# Patient Record
Sex: Male | Born: 1955 | Race: White | Hispanic: No | Smoking: Current every day smoker
Health system: Southern US, Community
[De-identification: ages and names within clinical notes are randomized; demographics above are authoritative.]

## PROBLEM LIST (undated history)

## (undated) DIAGNOSIS — I10 Essential (primary) hypertension: Secondary | ICD-10-CM

## (undated) HISTORY — PX: FRACTURE SURGERY: SHX138

---

## 2010-03-28 ENCOUNTER — Emergency Department: Payer: Self-pay | Admitting: Emergency Medicine

## 2010-07-01 ENCOUNTER — Emergency Department: Payer: Self-pay | Admitting: Emergency Medicine

## 2011-04-26 ENCOUNTER — Emergency Department: Payer: Self-pay | Admitting: Unknown Physician Specialty

## 2011-04-26 LAB — DRUG SCREEN, URINE
Barbiturates, Ur Screen: NEGATIVE (ref ?–200)
Benzodiazepine, Ur Scrn: NEGATIVE (ref ?–200)
Cannabinoid 50 Ng, Ur ~~LOC~~: POSITIVE (ref ?–50)
Cocaine Metabolite,Ur ~~LOC~~: POSITIVE (ref ?–300)
MDMA (Ecstasy)Ur Screen: NEGATIVE (ref ?–500)
Opiate, Ur Screen: NEGATIVE (ref ?–300)
Phencyclidine (PCP) Ur S: NEGATIVE (ref ?–25)
Tricyclic, Ur Screen: NEGATIVE (ref ?–1000)

## 2011-04-26 LAB — CBC
HCT: 42.3 % (ref 40.0–52.0)
HGB: 14.4 g/dL (ref 13.0–18.0)
MCH: 31 pg (ref 26.0–34.0)
MCHC: 34.1 g/dL (ref 32.0–36.0)
MCV: 91 fL (ref 80–100)
Platelet: 244 10*3/uL (ref 150–440)
RBC: 4.65 10*6/uL (ref 4.40–5.90)

## 2011-04-26 LAB — URINALYSIS, COMPLETE
Bacteria: NONE SEEN
Bilirubin,UR: NEGATIVE
Blood: NEGATIVE
Glucose,UR: NEGATIVE mg/dL (ref 0–75)
Leukocyte Esterase: NEGATIVE
Ph: 5 (ref 4.5–8.0)
RBC,UR: <1 /HPF (ref 0–5)
Squamous Epithelial: NONE SEEN
WBC UR: 1 /HPF (ref 0–5)

## 2011-04-27 LAB — BASIC METABOLIC PANEL
BUN: 8 mg/dL (ref 7–18)
Chloride: 108 mmol/L — ABNORMAL HIGH (ref 98–107)
Glucose: 127 mg/dL — ABNORMAL HIGH (ref 65–99)
Osmolality: 287 (ref 275–301)
Potassium: 3.9 mmol/L (ref 3.5–5.1)
Sodium: 144 mmol/L (ref 136–145)

## 2011-04-27 LAB — ACETAMINOPHEN LEVEL: Acetaminophen: <2 ug/mL

## 2011-04-27 LAB — ETHANOL: Ethanol: 101 mg/dL

## 2011-04-27 LAB — TROPONIN I: Troponin-I: <0.02 ng/mL

## 2011-06-08 ENCOUNTER — Emergency Department: Payer: Self-pay | Admitting: Emergency Medicine

## 2011-06-08 LAB — URINALYSIS, COMPLETE
Bilirubin,UR: NEGATIVE
Glucose,UR: NEGATIVE mg/dL
Leukocyte Esterase: NEGATIVE
Nitrite: NEGATIVE
Ph: 5
Protein: NEGATIVE
RBC,UR: <1 /HPF
Specific Gravity: 1.009
Squamous Epithelial: NONE SEEN
WBC UR: <1 /HPF

## 2011-06-08 LAB — CK TOTAL AND CKMB (NOT AT ARMC)
CK, Total: 306 U/L — ABNORMAL HIGH
CK-MB: 7.4 ng/mL — ABNORMAL HIGH (ref 0.5–3.6)
CK-MB: 5.7 ng/mL — ABNORMAL HIGH

## 2011-06-08 LAB — CBC
HCT: 42.9 %
HGB: 14.6 g/dL
MCH: 31.3 pg
MCHC: 33.9 g/dL
MCV: 92 fL
Platelet: 249 x10 3/mm 3
RBC: 4.65 x10 6/mm 3
RDW: 14.1 %
WBC: 8.5 x10 3/mm 3

## 2011-06-08 LAB — DRUG SCREEN, URINE
Amphetamines, Ur Screen: NEGATIVE
Barbiturates, Ur Screen: NEGATIVE
Benzodiazepine, Ur Scrn: NEGATIVE
Cannabinoid 50 Ng, Ur ~~LOC~~: NEGATIVE
Cocaine Metabolite,Ur ~~LOC~~: POSITIVE
MDMA (Ecstasy)Ur Screen: NEGATIVE
Methadone, Ur Screen: NEGATIVE
Opiate, Ur Screen: NEGATIVE
Phencyclidine (PCP) Ur S: NEGATIVE
Tricyclic, Ur Screen: NEGATIVE

## 2011-06-08 LAB — COMPREHENSIVE METABOLIC PANEL
Albumin: 4.1 g/dL (ref 3.4–5.0)
Alkaline Phosphatase: 79 U/L (ref 50–136)
BUN: 11 mg/dL (ref 7–18)
Bilirubin,Total: 1 mg/dL (ref 0.2–1.0)
Calcium, Total: 8.6 mg/dL (ref 8.5–10.1)
Creatinine: 0.88 mg/dL (ref 0.60–1.30)
Glucose: 87 mg/dL (ref 65–99)
SGOT(AST): 78 U/L — ABNORMAL HIGH (ref 15–37)
SGPT (ALT): 77 U/L
Total Protein: 7.5 g/dL (ref 6.4–8.2)

## 2011-06-08 LAB — TROPONIN I
Troponin-I: <0.02 ng/mL
Troponin-I: <0.02 ng/mL

## 2011-06-08 LAB — ETHANOL
Ethanol %: 0.046 %
Ethanol: 46 mg/dL

## 2011-06-15 ENCOUNTER — Emergency Department: Payer: Self-pay | Admitting: Emergency Medicine

## 2011-06-15 LAB — COMPREHENSIVE METABOLIC PANEL
Albumin: 4.1 g/dL (ref 3.4–5.0)
Alkaline Phosphatase: 89 U/L (ref 50–136)
BUN: 10 mg/dL (ref 7–18)
Bilirubin,Total: 0.9 mg/dL (ref 0.2–1.0)
Calcium, Total: 8.8 mg/dL (ref 8.5–10.1)
Creatinine: 1.01 mg/dL (ref 0.60–1.30)
EGFR (African American): >60
Glucose: 145 mg/dL — ABNORMAL HIGH (ref 65–99)
Osmolality: 285 (ref 275–301)
SGPT (ALT): 82 U/L — ABNORMAL HIGH
Sodium: 142 mmol/L (ref 136–145)

## 2011-06-15 LAB — DRUG SCREEN, URINE
Barbiturates, Ur Screen: NEGATIVE (ref ?–200)
Benzodiazepine, Ur Scrn: NEGATIVE (ref ?–200)
Cannabinoid 50 Ng, Ur ~~LOC~~: NEGATIVE (ref ?–50)
Cocaine Metabolite,Ur ~~LOC~~: POSITIVE (ref ?–300)
Opiate, Ur Screen: NEGATIVE (ref ?–300)
Tricyclic, Ur Screen: NEGATIVE (ref ?–1000)

## 2011-06-15 LAB — CBC
HGB: 15.5 g/dL (ref 13.0–18.0)
MCHC: 34 g/dL (ref 32.0–36.0)
Platelet: 291 10*3/uL (ref 150–440)
RBC: 4.92 10*6/uL (ref 4.40–5.90)

## 2011-06-15 LAB — TSH: Thyroid Stimulating Horm: 2.01 u[IU]/mL

## 2011-06-15 LAB — SALICYLATE LEVEL: Salicylates, Serum: 1.7 mg/dL

## 2011-06-15 LAB — ETHANOL: Ethanol %: 0.071 % (ref 0.000–0.080)

## 2011-09-13 ENCOUNTER — Emergency Department: Payer: Self-pay | Admitting: Emergency Medicine

## 2011-09-13 LAB — COMPREHENSIVE METABOLIC PANEL
Albumin: 3.8 g/dL (ref 3.4–5.0)
Anion Gap: 4 — ABNORMAL LOW (ref 7–16)
BUN: 12 mg/dL (ref 7–18)
Bilirubin,Total: 0.8 mg/dL (ref 0.2–1.0)
Calcium, Total: 8.6 mg/dL (ref 8.5–10.1)
Chloride: 107 mmol/L (ref 98–107)
Co2: 29 mmol/L (ref 21–32)
Creatinine: 1.37 mg/dL — ABNORMAL HIGH (ref 0.60–1.30)
EGFR (African American): >60
Osmolality: 280 (ref 275–301)
Potassium: 3.6 mmol/L (ref 3.5–5.1)
SGOT(AST): 34 U/L (ref 15–37)
SGPT (ALT): 56 U/L
Sodium: 140 mmol/L (ref 136–145)

## 2011-09-13 LAB — CBC
HCT: 47.5 % (ref 40.0–52.0)
MCH: 31.5 pg (ref 26.0–34.0)
MCHC: 33.4 g/dL (ref 32.0–36.0)
RBC: 5.04 10*6/uL (ref 4.40–5.90)
RDW: 14 % (ref 11.5–14.5)
WBC: 9.5 10*3/uL (ref 3.8–10.6)

## 2011-09-13 LAB — URINALYSIS, COMPLETE
Bacteria: NONE SEEN
Bilirubin,UR: NEGATIVE
Glucose,UR: NEGATIVE mg/dL (ref 0–75)
Ketone: NEGATIVE
Leukocyte Esterase: NEGATIVE
Ph: 6 (ref 4.5–8.0)
RBC,UR: 22 /HPF (ref 0–5)
Squamous Epithelial: NONE SEEN

## 2011-09-13 LAB — AMYLASE: Amylase: 28 U/L (ref 25–115)

## 2011-09-17 ENCOUNTER — Emergency Department: Payer: Self-pay | Admitting: Emergency Medicine

## 2011-09-17 LAB — CBC
HCT: 42.1 % (ref 40.0–52.0)
HGB: 14.1 g/dL (ref 13.0–18.0)
MCHC: 33.5 g/dL (ref 32.0–36.0)
RBC: 4.52 10*6/uL (ref 4.40–5.90)
WBC: 6.4 10*3/uL (ref 3.8–10.6)

## 2011-09-17 LAB — BASIC METABOLIC PANEL
Calcium, Total: 8.8 mg/dL (ref 8.5–10.1)
Chloride: 106 mmol/L (ref 98–107)
Creatinine: 1.08 mg/dL (ref 0.60–1.30)
Potassium: 4 mmol/L (ref 3.5–5.1)

## 2011-09-17 LAB — URINALYSIS, COMPLETE
Bilirubin,UR: NEGATIVE
Ketone: NEGATIVE
Ph: 6 (ref 4.5–8.0)
Protein: NEGATIVE
RBC,UR: 2 /HPF (ref 0–5)
Specific Gravity: 1.008 (ref 1.003–1.030)
Squamous Epithelial: NONE SEEN

## 2011-09-20 ENCOUNTER — Emergency Department: Payer: Self-pay | Admitting: Emergency Medicine

## 2011-10-30 ENCOUNTER — Inpatient Hospital Stay: Payer: Self-pay | Admitting: Internal Medicine

## 2011-10-30 LAB — COMPREHENSIVE METABOLIC PANEL
Albumin: 3.4 g/dL (ref 3.4–5.0)
Anion Gap: 6 — ABNORMAL LOW (ref 7–16)
BUN: 13 mg/dL (ref 7–18)
Bilirubin,Total: 0.7 mg/dL (ref 0.2–1.0)
Chloride: 111 mmol/L — ABNORMAL HIGH (ref 98–107)
Creatinine: 1.28 mg/dL (ref 0.60–1.30)
EGFR (African American): >60
Glucose: 136 mg/dL — ABNORMAL HIGH (ref 65–99)
Osmolality: 287 (ref 275–301)
Potassium: 3.9 mmol/L (ref 3.5–5.1)
SGPT (ALT): 53 U/L (ref 12–78)
Sodium: 143 mmol/L (ref 136–145)
Total Protein: 6.7 g/dL (ref 6.4–8.2)

## 2011-10-30 LAB — CBC
HGB: 14.6 g/dL (ref 13.0–18.0)
MCH: 32.3 pg (ref 26.0–34.0)
MCV: 95 fL (ref 80–100)
Platelet: 245 10*3/uL (ref 150–440)
RBC: 4.52 10*6/uL (ref 4.40–5.90)
RDW: 14.6 % — ABNORMAL HIGH (ref 11.5–14.5)
WBC: 5.7 10*3/uL (ref 3.8–10.6)

## 2011-10-30 LAB — URINALYSIS, COMPLETE
Glucose,UR: NEGATIVE mg/dL (ref 0–75)
Granular Cast: 1
Ketone: NEGATIVE
Leukocyte Esterase: NEGATIVE
Specific Gravity: 1.011 (ref 1.003–1.030)
Squamous Epithelial: NONE SEEN

## 2011-10-30 LAB — DRUG SCREEN, URINE
Barbiturates, Ur Screen: POSITIVE (ref ?–200)
Cannabinoid 50 Ng, Ur ~~LOC~~: NEGATIVE (ref ?–50)
Cocaine Metabolite,Ur ~~LOC~~: POSITIVE (ref ?–300)
Methadone, Ur Screen: NEGATIVE (ref ?–300)
Opiate, Ur Screen: POSITIVE (ref ?–300)
Phencyclidine (PCP) Ur S: NEGATIVE (ref ?–25)

## 2011-10-30 LAB — ACETAMINOPHEN LEVEL: Acetaminophen: <2 ug/mL

## 2011-10-30 LAB — ETHANOL: Ethanol: <3 mg/dL

## 2011-10-31 ENCOUNTER — Inpatient Hospital Stay: Payer: Self-pay | Admitting: Psychiatry

## 2011-10-31 LAB — CBC WITH DIFFERENTIAL/PLATELET
Basophil %: 0.9 %
Eosinophil #: 0.1 10*3/uL (ref 0.0–0.7)
Eosinophil %: 1.4 %
HCT: 40.3 % (ref 40.0–52.0)
Lymphocyte %: 24.3 %
MCH: 32.2 pg (ref 26.0–34.0)
MCHC: 34.3 g/dL (ref 32.0–36.0)
MCV: 94 fL (ref 80–100)
Neutrophil %: 63.3 %
Platelet: 211 10*3/uL (ref 150–440)
RBC: 4.3 10*6/uL — ABNORMAL LOW (ref 4.40–5.90)
RDW: 14.6 % — ABNORMAL HIGH (ref 11.5–14.5)
WBC: 6.8 10*3/uL (ref 3.8–10.6)

## 2011-10-31 LAB — BASIC METABOLIC PANEL
Anion Gap: 7 (ref 7–16)
BUN: 7 mg/dL (ref 7–18)
Calcium, Total: 7.9 mg/dL — ABNORMAL LOW (ref 8.5–10.1)
Co2: 24 mmol/L (ref 21–32)
Creatinine: 0.93 mg/dL (ref 0.60–1.30)
EGFR (African American): >60
Glucose: 125 mg/dL — ABNORMAL HIGH (ref 65–99)
Osmolality: 283 (ref 275–301)
Sodium: 142 mmol/L (ref 136–145)

## 2011-10-31 LAB — SALICYLATE LEVEL: Salicylates, Serum: 2.4 mg/dL

## 2011-11-03 LAB — URINALYSIS, COMPLETE
Bilirubin,UR: NEGATIVE
Leukocyte Esterase: NEGATIVE
Nitrite: NEGATIVE
Ph: 7 (ref 4.5–8.0)
Protein: NEGATIVE
RBC,UR: 27 /HPF (ref 0–5)

## 2013-07-16 IMAGING — CR DG ABDOMEN 1V
1 series · 2 of 2 positions shown · non-contrast
Comparison: none

REASON FOR EXAM: R/O Nephrolithiasis
COMMENTS:

[Series 1: supine kub · 0.17mm/px · 2 of 2 slices shown]
[im 1/2]
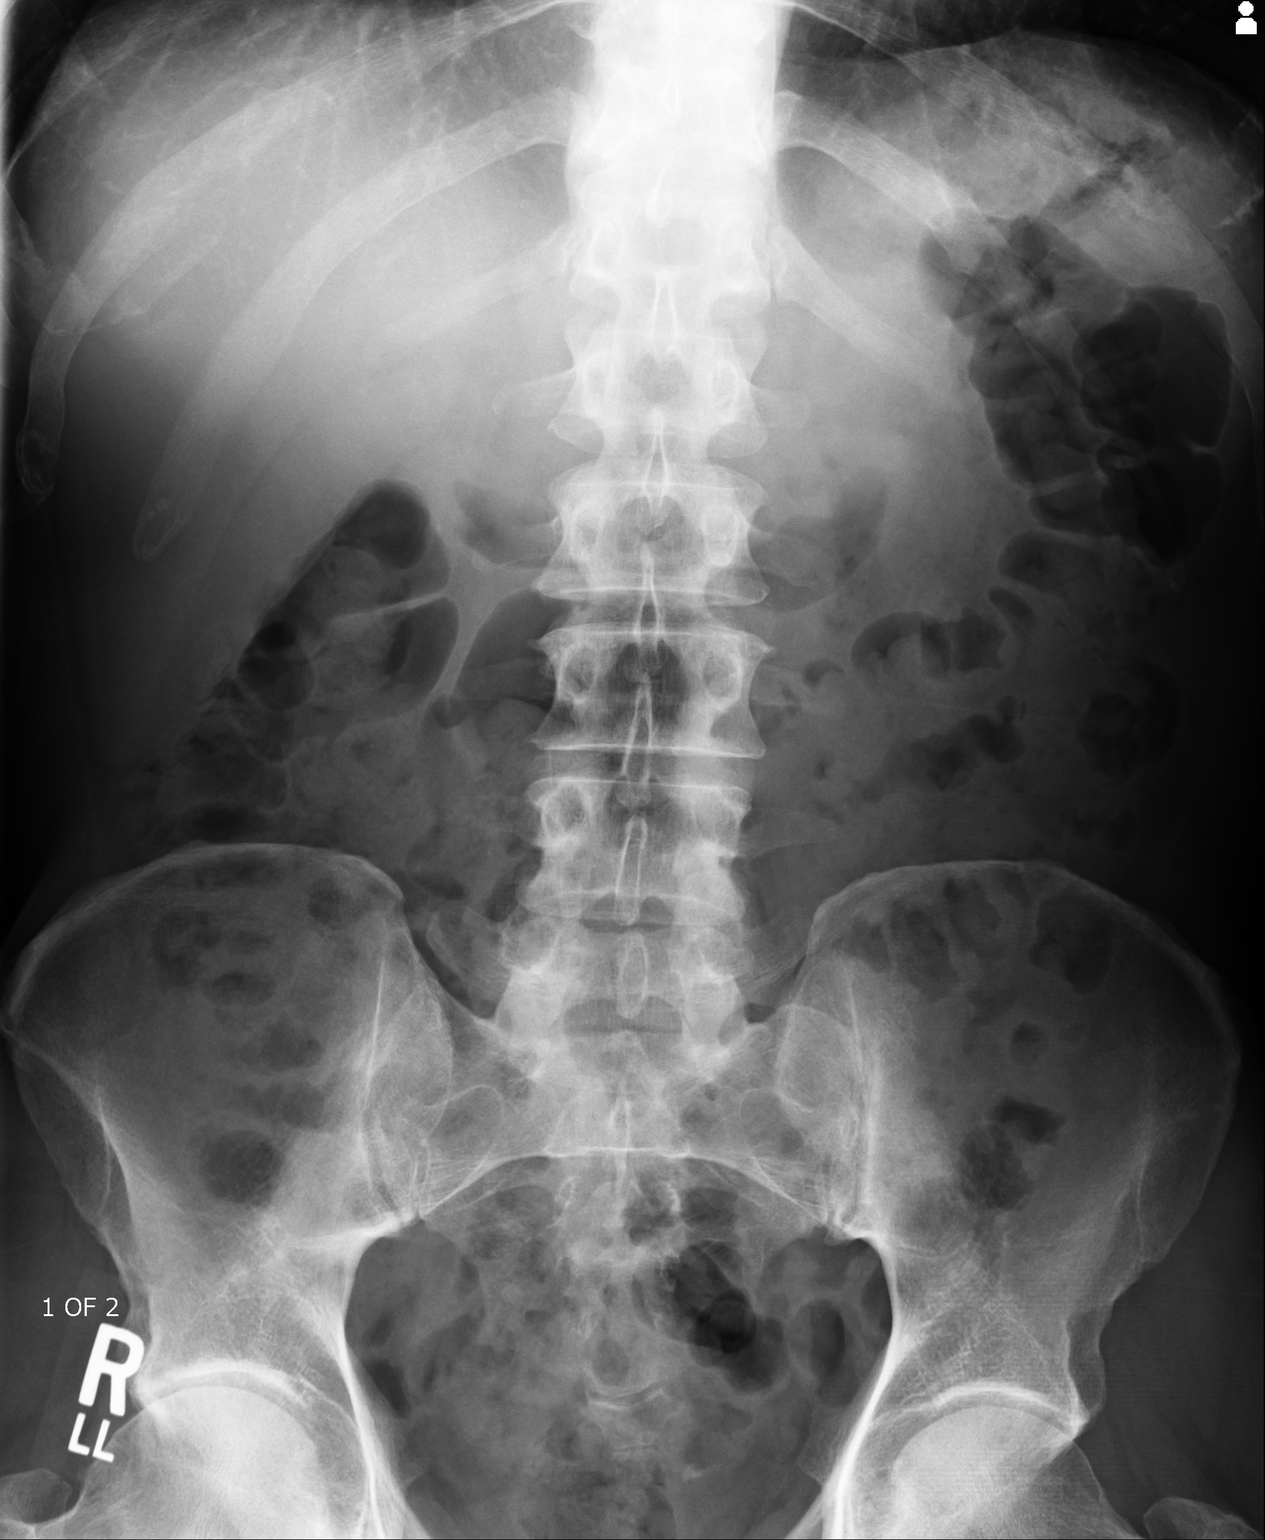
[im 2/2]
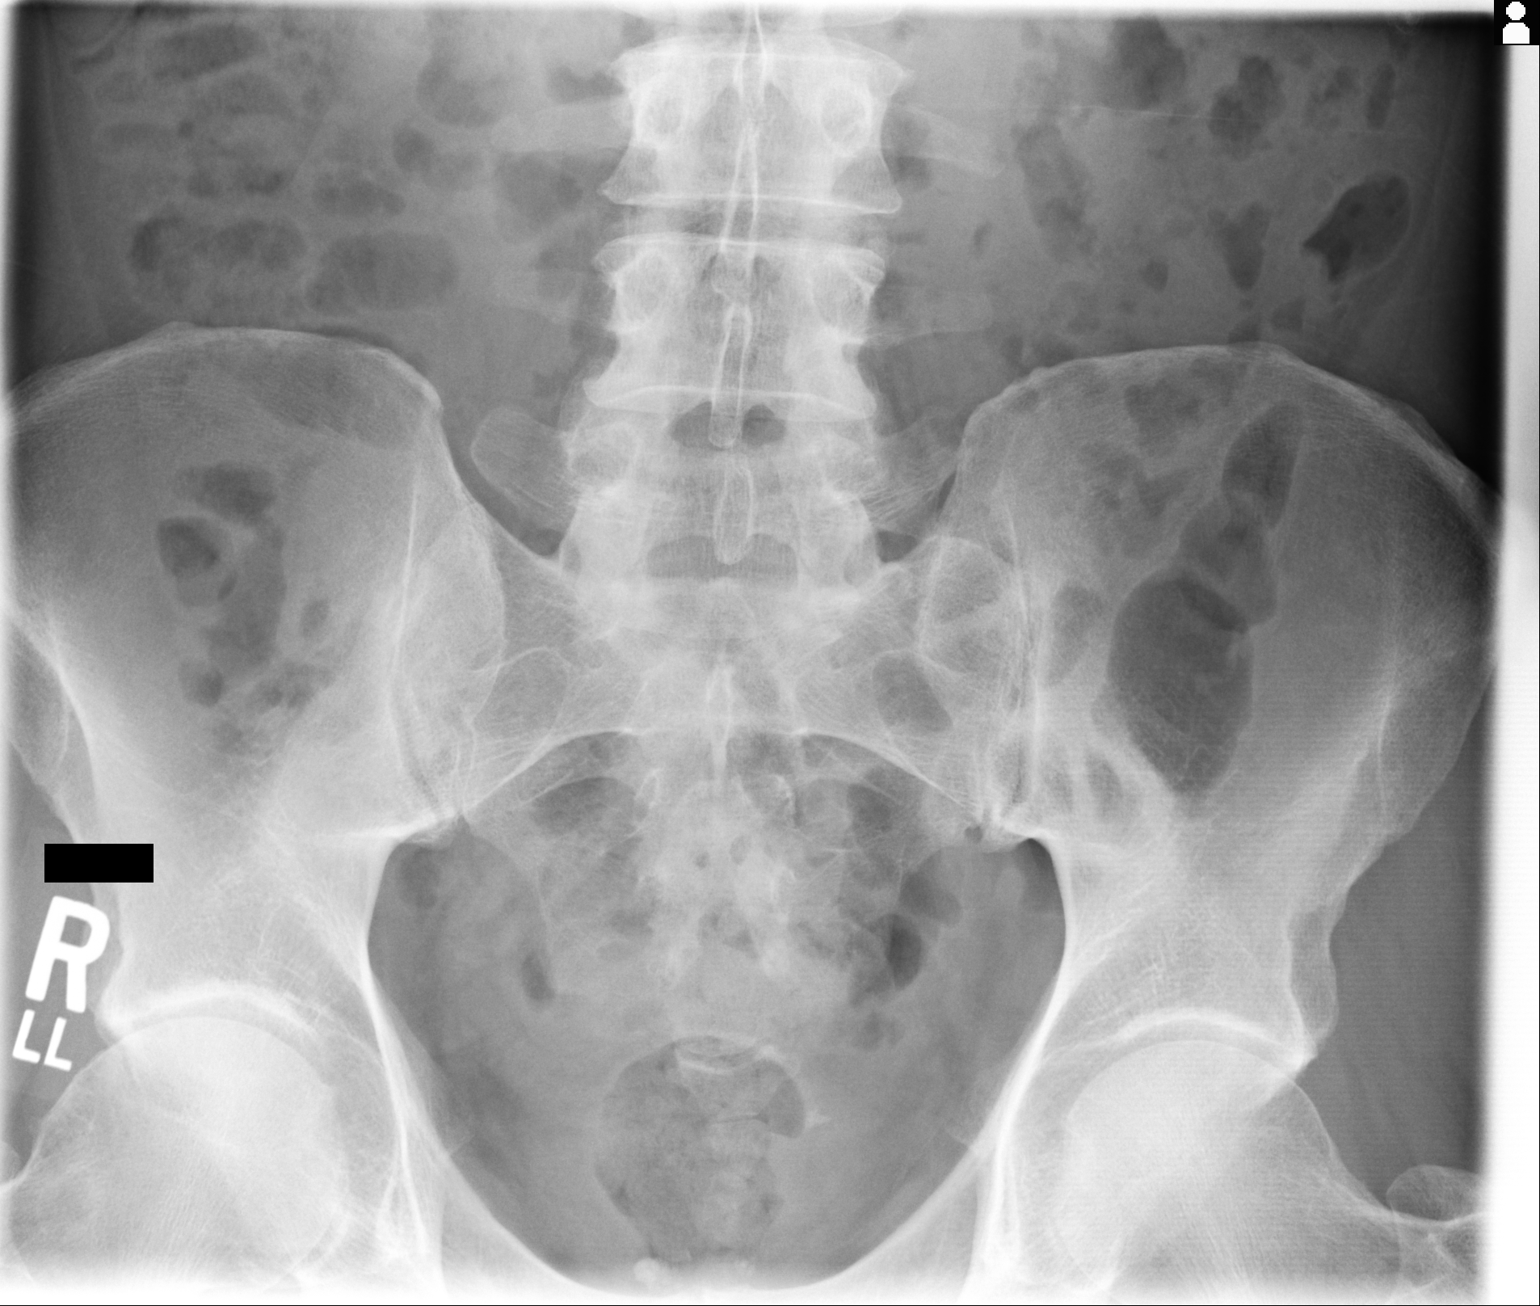

[2 of 2 positions shown; findings below may reference images not displayed]

PROCEDURE:     DXR - DXR KIDNEY URETER BLADDER  - November 04, 2011 [DATE]

RESULT:

Air is seen within nondilated loops of large and small bowel. There does not
appear to be evidence of calcified densities projecting in the expected
course of the right and left urinary collecting systems. The visualized bony
skeleton is unremarkable.
IMPRESSION: 1.  Nonobstructive bowel gas pattern.
2.  No radiographic evidence of renal calculus disease.

## 2014-07-12 NOTE — Discharge Summary (Signed)
PATIENT NAME:  Jorge Maldonado, SIRES MR#:  [id number] DATE OF BIRTH:  04-17-1955  DATE OF ADMISSION:  10/30/2011 DATE OF DISCHARGE:  10/31/2011  ADMITTING DIAGNOSIS: Overdose on multiple medications as well as hypotension.   DISCHARGE DIAGNOSES:  1. Overdose on multiple medications due to a suicidal attempt, status post psych evaluation. Will need further treatment in the Behavioral Unit.  2. Hypotension, result of antihypertensive overdose. Blood pressure is now normalized with epinephrine drip as well as normal saline infusion.  3. History of polysubstance abuse with alcohol and cocaine. He will need further detoxification.  4. History of suicidal ideation in the past.  5. Chronic Hepatitis C.  6. Osteoarthritis.  7. Status post right shoulder surgery.   PERTINENT LABORATORY AND EVALUATIONS: Glucose 136, BUN 13, creatinine 1.28, sodium 143, potassium 3.9, chloride 111. LFTs were normal. TSH 5.11. TUDS were barbiturate positive, cocaine positive, and opiate positive. WBC 5.7, hemoglobin 14.6, platelet count 245. Urinalysis nitrites negative, leukocytes negative. Salicylate level 3.2. Acetaminophen was less than 2.   EKG normal sinus rhythm without any ST-T wave changes.   CONSULTANT: Dr. Kathrine Haddock COURSE: Please refer to history and physical for details. In brief, the patient is a 59 year old white male with history of suicidal ideation and depression in the past who was brought to the ED after the patient ingested multiple medications including Klonopin and Percocet. He took a few of his mother's antihypertensives. The patient in the ED was noted to be very hypotensive. We were asked to admit the patient for medical monitoring. The patient was kept on aggressive IV fluids. He had to be started on epinephrine drip. With these interventions his hypotension resolved. The patient has been very depressed due to his recent social issues. He was seen by Dr. Maryruth Bun who saw the patient and recommended  he needed transfer to Behavioral Unit. The patient is currently stable for transfer to Behavioral Unit. He does have history of hypertension. If his blood pressure starts increasing, he can resume his doxazosin as taking at home. At this time he is doing much better and is stable for discharge.   DISCHARGE MEDICATIONS: Currently none.   FURTHER TREATMENT: Per Psychiatry   HOME OXYGEN: None.   DIET: Low sodium.   ACTIVITY: As tolerated.   REFERRAL: To Behavioral Medicine with disposition after psychiatric treatment from there.  TIME SPENT: 35 minutes.   ____________________________ [name], MD shp:drc D: 10/31/2011 15:19:00 ET T: 11/01/2011 10:48:15 ET JOB#: [id number]  cc: Kaydince Towles H. Allena Katz, MD, <Dictator> Charise Carwin MD ELECTRONICALLY SIGNED 11/02/2011 10:50

## 2014-07-12 NOTE — Consult Note (Signed)
Brief Consult Note: Diagnosis: S/P Overdose R/O Major Depression; Polysubstance Abuse.   Patient was seen by consultant.   Recommend further assessment or treatment.   Orders entered.   Comments: Jorge Maldonado is a 59 y/o Caucasian male with a history of polysubstance abuse including alcohol, cocaine and opiod use who was admitted to the CCU after overdosing on multiple medications. Per the intake nurse, he has been drinking close to a gallon of  beer daily and abusing cocaine and opiods. He was supposed to be in court today but overdosed instead because he thought his attorney was not doing anything for him. The patient himself was lethargic and not able to participate in the interview. Therefore, full consult could not be completed. Records from consult with Dr. Alycia Rossetti in March, 2013 reviewed. Please keep with 1:1 and on suicide precautions for now. Patient is under IVC. Will start Ativan per CIWA as well as give MVI, Thiamine and FA for alcohol withdrawl. Will return tomorrow to see if full consult can be completed. Agree with holding Celexa and Trazadone for now due to overdose.  Electronic Signatures: Caryn Section (MD)  (Signed 07-Aug-13 17:31)  Authored: Brief Consult Note   Last Updated: 07-Aug-13 17:31 by Caryn Section (MD)

## 2014-07-12 NOTE — H&P (Signed)
PATIENT NAME:  Jorge Maldonado MR#:  [id number] DATE OF BIRTH:  1955-08-25  DATE OF ADMISSION:  10/31/2011  REFERRING PHYSICIAN: Auburn Bilberry, MD    ADMITTING PHYSICIAN: Caryn Section, MD   REASON FOR ADMISSION: Status post overdose in suicide attempt.   IDENTIFYING INFORMATION: Jorge Maldonado is a 59 year old divorced unemployed Caucasian male currently living in the [place] area with his sister, brother-in-law and mother. He has one son, age 26. He has just been released from incarceration this past January and has spent over 30 years in jail already.   HISTORY OF PRESENT ILLNESS: Jorge Maldonado is a 59 year old divorced Caucasian male with a history of recurrent depression as well as polysubstance dependence, including alcohol dependence, opioid abuse, and cocaine abuse, who was admitted to the Critical Care Unit after he overdosed on a combination of multiple medications. He says he took all of his mother's doxazosin, 3 tablets of Klonopin and 2 Percocet. He  was admitted to the Critical Care Unit and placed on an epinephrine drip. The patient was apparently in the middle of a jury trial for a breaking and entering charge. He says he overdosed instead of going to court because he said he would rather be dead than go back to jail. He has been drinking 24 beers on a daily basis almost every day since being released from prison in January. He also has been abusing cocaine 2 to 3 times a week and abusing Percocet, Fioricet and Klonopin. He denies any heroin use. The patient says his depressive symptoms have worsened within the past 2 to 3 weeks as he is facing more legal charges and possibly being incarcerated again. He does endorse feelings of hopelessness and helplessness, frequent crying spells, and difficulty with focus and concentration. He also reports having difficulty with a low energy level. He denies any insomnia but says that the alcohol helps him to drink. No change in appetite. He has been  feeling suicidal for the past 2 to 3 days. The patient says that he wished that the overdose would have killed him. He denies any current psychotic symptoms, including auditory or visual hallucinations. No paranoid thoughts or delusions. The patient denies any problems with anxiety or panic attacks. He has had difficulty with irritable mood swings in the past but denies any history of any violence. He also denies any history of symptoms consistent with bipolar mania, including grandiose delusions, hyperreligious thoughts, hypersexual behavior, or decreased sleep for several days at a time with increased goal-directed behavior.   PAST PSYCHIATRIC HISTORY: The patient was diagnosed with ADD while in prison. He has been hospitalized at [facility] in the past, although [facility] records only show multiple ER visits. He does admit to going to [facility] in the past but has not been in any residential substance abuse treatment program. The patient says that he was tried on Celexa, which made him physically sick, and Wellbutrin in prison for ADD and depression, but Wellbutrin gave him a headache. He denies being on any mood stabilizers or antipsychotics in the past.   SUBSTANCE ABUSE HISTORY: As stated in the history of present illness, there is a history of alcohol dependence, cocaine and opioid abuse. The patient is denying any recent cannabis abuse. He also does abuse some prescription narcotics and benzodiazepines. He has been smoking a pack of cigarettes per day since the age of 74. The patient does admit to a history of IV drug use in the past as well.   FAMILY PSYCHIATRIC  HISTORY: The patient reports that his father died of an overdose. He denies any other mental illness or substance use in the family.   PAST MEDICAL/SURGICAL HISTORY:  1. Osteoarthritis. 2. Right shoulder surgery.  3. He denies any history of any prior TBI or seizures.   OUTPATIENT MEDICATIONS: None.   ALLERGIES: No known drug  allergies.   SOCIAL HISTORY: The patient was born and raised in the [place] area by both his biological parents. The patient's dad died when he was 78 years old after overdosing on alcohol, and he was raised by his mother primarily. He still lives with his sister, brother-in-law and mother in the [place] area. He is currently divorced and has one son, age 69, who lives in [place]; but he has not had any contact with his son for the past seven years. The patient went to the tenth grade at [employer] and then obtained his GED when in prison. He has been incarcerated for over 30 years and recently got out of jail in January. He is now facing more legal charges. He denies any history of any physical or sexual abuse.   LEGAL HISTORY: The patient has been over 30 years in prison for drug charges, breaking and entering and selling drugs. He is a habitual felon and is now facing another breaking and entering charge. He was currently in the middle of a jury trial when he overdosed.   MENTAL STATUS EXAM: Jorge Maldonado is a 59 year old fairly tall Caucasian male who was lying in his hospital bed. He was mildly anxious and slightly irritable. He was fully alert and oriented to time, place, and situation. Speech was slow and soft but fluent and coherent. Mood was depressed, and affect was anxious. Thought processes were linear, logical, and goal-directed. He did endorse some intermittent passive suicidal thoughts but denied any specific plan currently. He denied any homicidal thoughts, auditory or visual hallucinations. He denied any paranoid thoughts or delusions. Attention and concentration were fairly good. Recall was three out of three initially and three out of three after five minutes. He named the presidents backwards as Obama, Bush, Bush and [place]. He was able to spell world backwards correctly. Abstraction was good. Judgment and insight were fairly good by testing but poor by history.    SUICIDE RISK ASSESSMENT: At this time the patient is endorsing intermittent passive suicidal thoughts but no homicidal thoughts. He is not able to contract for safety outside of the hospital and is still extremely worried about current legal charges. His risk of harm to self and others at this time is moderate-to-high.   REVIEW OF SYSTEMS: CONSTITUTIONAL: He denies any weight changes but does complain of some weakness and fatigue. He denies any fever, chills, or night sweats. HEAD: He denies headaches or dizziness. EYES: He denies any diplopia or blurred vision. ENT: He denies any hearing loss, neck pain or throat pain. RESPIRATORY: He denies any shortness breath or cough. CARDIOVASCULAR: He denies any chest pain or orthopnea. GASTROINTESTINAL: He denies any nausea, vomiting, or abdominal pain. He denies any change in bowel movements. GENITOURINARY: He denies incontinence or problems with frequency of urine. ENDOCRINE: He denies any heat or cold intolerance. LYMPHATIC: He denies any anemia or easy bruising. MUSCULOSKELETAL: He denies any muscle or joint pain. NEUROLOGIC: He denies any tingling or weakness. PSYCHIATRIC: Please see history of present illness.   PHYSICAL EXAMINATION:  VITAL SIGNS: Blood pressure 114/67, heart rate 64, respirations 18, temperature 97.9. Please see initial Physical Exam  as completed by admitting physician, Auburn Bilberry.   LABORATORY, DIAGNOSTIC AND RADIOLOGICAL DATA: Sodium 142, potassium 3.6, chloride 111, CO2 24, BUN 7, creatinine 0.93, glucose 125, alkaline phosphatase 79, AST 62, ALT 53. TSH elevated at 5.11. Toxicology screen positive for barbiturates, cocaine and opioids but negative for all other substances. White blood cell count 6.8, hemoglobin 13.8, platelet count 211. Urinalysis showed 4 WBCs and was nitrite and leukocyte esterase negative. Salicylates were 2.4. EKG showed a QTc interval of 418.    DIAGNOSES:  AXIS I:  1. Major depression, recurrent, without  psychotic features.  2. Alcohol dependence. 3. Cocaine dependence. 4. Opioid dependence.  5. Nicotine dependence.   AXIS II: Rule out personality disorder.   AXIS III:  1. Elevated thyroid-stimulating hormone. We will repeat thyroid-stimulating hormone  and T4 in the a.m.  2. Hepatitis C.  3. Hypertension. 4. Osteoarthritis. 5. Right shoulder surgery.   AXIS IV: Severe legal problems, comorbid substance use.   AXIS V: Global Assessment of Functioning score at present equals 25.   ASSESSMENT AND TREATMENT RECOMMENDATIONS: Jorge Maldonado is a 59 year old divorced Caucasian male with a long history of polysubstance dependence and recurrent depression who was admitted to the Critical Care Unit after intentionally overdosing on multiple medications in a suicide attempt. He says he would rather die than go back to prison. He has already been incarcerated for over 30 years and is facing new legal charges. We will plan to admit to Inpatient Psychiatry for medication management, safety, stabilization and detox once medically cleared from the Medicine service.   1. Major depression, recurrent, without psychotic features: We will plan to start the patient on Zoloft 50 mg p.o. daily for depression and trazodone 100 mg p.o. nightly for insomnia. We will check B12 and folic acid in the a.m.  2. Hepatitis C: LFTs are not significantly elevated. The patient was advised that drinking alcohol may worsen liver, and he is at risk of developing cirrhosis of the liver.  3. Polysubstance dependence, including alcohol dependence, cocaine and opioid dependence: We will place the patient on Ativan per CIWA as well as give multivitamin, thiamine and folic acid. The patient was advised to abstain from alcohol and all illicit drugs. We will evaluate to see if the patient is willing to undergo residential substance abuse treatment.  4. Hypertension: Blood pressure is currently stable. We will restart doxazosin 2 mg p.o.  daily. 5. Osteoarthritis: The patient is currently on methocarbamol 500 mg p.o. b.i.d.  6. Disposition: We will see if the patient is willing to complete residential substance abuse treatment. Otherwise, he has a stable living situation. We will need to arrange for psychotropic medication management and substance abuse treatment followup in the community with [facility]. The patient will remain under IVC at this time.   TIME SPENT: 60 minutes  ____________________________ Doralee Albino. Maryruth Bun, MD akk:cbb D: 11/01/2011 09:23:11 ET T: 11/01/2011 09:52:08 ET JOB#: [id number]  cc: Chukwuemeka Artola K. Maryruth Bun, MD, <Dictator> Darliss Ridgel MD ELECTRONICALLY SIGNED 11/04/2011 14:02

## 2014-07-12 NOTE — H&P (Signed)
PATIENT NAME:  Jorge Maldonado, Jorge Maldonado MR#:  [id number] DATE OF BIRTH:  1955/03/28  DATE OF ADMISSION:  10/30/2011  PRIMARY CARE PHYSICIAN: Vonita Moss, MD  ED REFERRING PHYSICIAN: Marge Duncans, MD  CHIEF COMPLAINT: Overdose on multiple medications as well as hypotension.   HISTORY OF PRESENT ILLNESS: The patient is a 59 year old Caucasian male with history of suicidal ideation in the past and depression who was brought in to the ED after the patient had ingested three tablets of Klonopin and two Percocets. He also took a few tablets of his mother's blood pressure medication and took all of his doxazosin. It is not clear how many doxazosin that he took, but this was per his sister. The patient apparently had a court case yesterday and it did not go well, according to his sister. He came home and was very depressed and anxious and then earlier this morning the patient took above-stated pills. The patient arrived in the ED feeling very cold. He was also hypotensive. He had to receive IV fluid bolus and then was started on an epinephrine drip. He currently states that he is feeling very cold. He is not having any chest pain, no shortness of breath, no abdominal pain, no nausea, and no vomiting or diarrhea.   PAST MEDICAL HISTORY:  1. Polysubstance abuse. 2. History of suicidal ideation in the past. 3. Hepatitis C.  4. Hypertension.  5. Osteoarthritis.   PAST SURGICAL HISTORY: Status post right shoulder surgery.   ALLERGIES: No known drug allergies.   CURRENT MEDICATIONS:  1. Percocet 325/5 mg 2 tabs every six hours p.r.n.  2. Citalopram 20 mg daily.  3. Doxazosin 2 mg daily.  4. Methocarbamol 500 mg 1 tab p.o. twice a day with meals.  5. Flomax 0.4 mg daily. Trazodone 50 mg at bedtime.   SOCIAL HISTORY: He is a smoker. He drinks in binges. He also does cocaine.   FAMILY HISTORY: Positive for hypertension.   REVIEW OF SYSTEMS: CONSTITUTIONAL: Denies any fevers, chills. No weight loss. No  weight gain. HEENT: Denies any visual difficulties. No double vision. No cataracts. No glaucoma. NOSE: Denies any nasal drainage. No epistaxis. EARS: Denies any hearing difficulties. No drainage from the ears. CARDIOVASCULAR: Denies any chest pain or palpitations. No arrhythmia. No shortness of breath. PULMONARY: Denies any chronic obstructive pulmonary disease. No wheezing. No asthma. GASTROINTESTINAL: Denies any nausea, vomiting, or diarrhea. No abdominal pain. GU: Denies any hematuria, frequency, or urgency. ENDOCRINE: Denies any polyuria or nocturia. No heat or cold intolerance. HEME/LYMPH: Denies anemia or easy bruisability. SKIN: Denies any rash. MUSCULOSKELETAL: Has intermittent joint pain. NEURO: Denies any numbness, CVA, or transient ischemic attack.   PHYSICAL EXAMINATION:   VITAL SIGNS: Temperature 97.8, pulse 72, blood pressure 84/50, respirations 16, and O2 94% on 2 liters.   GENERAL: The patient is a Caucasian male, well-developed. Has multiple blankets on him stating that he is very cold.   HEENT: Head atraumatic, normocephalic. Pupils equally round and reactive to light and accommodation. There is no conjunctival pallor. No scleral icterus. Nasal exam shows no drainage or ulceration. Oropharynx is clear without any exudates.   NECK: No thyromegaly. No carotid bruits.   CARDIOVASCULAR: Regular rate and rhythm. No murmurs, rubs, clicks, or gallops. PMI is not displaced.   LUNGS: Clear to auscultation bilaterally without any rales, rhonchi, or wheezing.   ABDOMEN: Soft, nontender, and nondistended. Positive bowel sounds x4. There is no hepatosplenomegaly.   EXTREMITIES: No clubbing, cyanosis, or edema.   SKIN: No  rash.   LYMPHATICS: No lymph nodes palpable.   VASCULAR: Good DP and PT pulses.   NEUROLOGICAL: Cranial nerves II through XII grossly intact. No focal deficits.   PSYCHIATRIC: He is depressed, suicidal, alert and oriented to time, person and place.   EVALUATIONS:  Glucose was 136 on presentation, BUN 13, creatinine 1.28, sodium 143, potassium 3.9, and chloride 111. LFTs were normal, except slightly elevated AST. TSH was 5.11. TUDs were barbiturates positive, cocaine positive, and opioids positive. WBC 5.7, hemoglobin 14.6, and platelet count 245.   Urinalysis was nitrite negative, leukocytes negative.   Salicylate level is elevated at 3.2. Acetaminophen was less than 2.  EKG: Normal sinus rhythm without any ST-T wave changes.   ASSESSMENT AND PLAN: The patient is a 59 year old with history of suicidal ideation, depression, polysubstance abuse, hepatitis C, and hypertension who comes in with suicidal attempt with multiple medications, especially multiple blood pressure medications including doxazosin. The patient's blood pressure is low and currently he is on epinephrine drip, started in the ED. 1. Overdose on multiple medications, currently hypotensive. At this time, we will continue with aggressive IV fluids, continue epinephrine. The patient is at very high risk of cardiopulmonary arrest in light of his overdose on multi-antihypertensives and hypotension. We will place him in the Critical Care Unit, monitor his cardiac rhythm, and his electrolytes. Try to keep his map above 65. He will need further evaluation with psychiatry.  2. History of hypertension. We will hold doxazosin in light of hypotension.  3. Hepatitis chronic hepatitis C.  4. History of depression. We will hold Celexa and trazodone for the time being in light of multiple overdoses. We will wait for psychiatry to see the patient.   This is a critical care history and physical in light of the patient having significant hypotension. He is on a pressor and at high risk of cardiopulmonary compromise.  TIME SPENT: 35 minutes of critical care time. ____________________________ Jorge Scotts Allena Katz, MD shp:slb D: 10/30/2011 15:00:42 ET T: 10/30/2011 15:25:48 ET JOB#: [id number]  cc: Sayaka Hoeppner H. Allena Katz,  MD, <Dictator> Steele Sizer, MD Charise Carwin MD ELECTRONICALLY SIGNED 10/31/2011 13:43

## 2014-07-17 NOTE — Consult Note (Signed)
PATIENT NAME:  Jorge Maldonado, Jorge Maldonado MR#:  [id number] DATE OF BIRTH:  12-Aug-1955  DATE OF CONSULTATION:  06/17/2011  REFERRING PHYSICIAN:  Dr. Marilynne Halsted CONSULTING PHYSICIAN:  Dewaine Conger II, MD  HISTORY OF PRESENT ILLNESS: The patient presented in somewhat of a suicidal state after drinking beer and taking cocaine. He periodically uses beer and cocaine and when he does at that time he gets suicidal. Otherwise, he is not suicidal. He has been cleared of the cocaine and alcohol for 48 hours and now no longer feels suicidal.   The patient does report to me however depression for several years with slight decrease in sleep, slight decrease in interest, and slight increase in guilt with no suicidal thinking. Energy is okay, concentration is okay, and appetite is okay during the depressive episodes. He has been placed on Celexa and trazodone since coming to the hospital. He has never had an antidepressant trial.   The patient also reports ADD history diagnosed actually while he was in prison. He has had no treatment for that. He reports fairly marked concentration problems and distractibility.   He has been in prison off and on and been out of prison since January mostly for drug-related offenses.   Currently, again he denies suicidal thinking.   MENTAL STATUS EXAMINATION: White male, cooperative and coherent and able to give a good history, not depressed. No hallucinations or delusions. No psychosis.   SUICIDE RISK ASSESSMENT: Previous suicide attempt negative. Suicidal thoughts negative. Impulsivity positive. Panic negative. Anxiety and turmoil negative. Anhedonia negative. Concentration negative. Hopelessness negative. Insomnia positive. Energy negative. Treatment alliance negative. Illness and pain negative. Prior attempts negative. Substance abuse positive. Family history negative. Childhood abuse negative. Marital status positive. Having children negative. Firearms negative. Age greater than 65  negative. Gender male positive. Suicide risk currently is low.      DIAGNOSES:  AXIS I:  1. Major depressive episode, not suicidal. 2. Polysubstance dependence.   AXIS II: Personality disorder, not otherwise specified.  ASSESSMENT AND PLAN: The patient should have a trial of an antidepressant, Celexa 20 mg daily, as well as trazodone 50 mg at bedtime. Also refer to mental health for further depression treatment and perhaps evaluation of ADD.  ____________________________ Venida Jarvis, MD wjr:[id number] D: 06/17/2011 13:23:36 ET T: 06/17/2011 13:40:17 ET JOB#: [id number]  cc: Venida Jarvis, MD, <Dictator> Jules Husbands MD ELECTRONICALLY SIGNED 06/19/2011 16:44

## 2019-08-28 ENCOUNTER — Emergency Department
Admission: EM | Admit: 2019-08-28 | Discharge: 2019-08-28 | Disposition: A | Discharge status: Home / Self Care | Payer: No Typology Code available for payment source | Attending: Student | Admitting: Student

## 2019-08-28 ENCOUNTER — Emergency Department: Payer: No Typology Code available for payment source

## 2019-08-28 ENCOUNTER — Other Ambulatory Visit: Payer: Self-pay

## 2019-08-28 ENCOUNTER — Encounter: Payer: Self-pay | Admitting: Emergency Medicine

## 2019-08-28 DIAGNOSIS — Y9389 Activity, other specified: Secondary | ICD-10-CM | POA: Insufficient documentation

## 2019-08-28 DIAGNOSIS — R519 Headache, unspecified: Secondary | ICD-10-CM | POA: Diagnosis not present

## 2019-08-28 DIAGNOSIS — Y998 Other external cause status: Secondary | ICD-10-CM | POA: Insufficient documentation

## 2019-08-28 DIAGNOSIS — Z5320 Procedure and treatment not carried out because of patient's decision for unspecified reasons: Secondary | ICD-10-CM | POA: Diagnosis not present

## 2019-08-28 DIAGNOSIS — M25561 Pain in right knee: Secondary | ICD-10-CM | POA: Diagnosis present

## 2019-08-28 DIAGNOSIS — M542 Cervicalgia: Secondary | ICD-10-CM | POA: Insufficient documentation

## 2019-08-28 DIAGNOSIS — W19XXXA Unspecified fall, initial encounter: Secondary | ICD-10-CM | POA: Diagnosis not present

## 2019-08-28 DIAGNOSIS — F17200 Nicotine dependence, unspecified, uncomplicated: Secondary | ICD-10-CM | POA: Diagnosis not present

## 2019-08-28 DIAGNOSIS — Y92017 Garden or yard in single-family (private) house as the place of occurrence of the external cause: Secondary | ICD-10-CM | POA: Insufficient documentation

## 2019-08-28 NOTE — ED Triage Notes (Signed)
Pt to ED via [facility]. Pt states that he fell this morning, hurting his back and left shoulder. Pt is in [facility].
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
## 2019-08-28 NOTE — ED Notes (Signed)
Pt has been to x-ray and ct scan  States he has been nauseated    No active vomiting noted  Pt has been sleeping      Attempted to call for ride  No answer

## 2019-08-28 NOTE — ED Notes (Signed)
Pt is resting with eyes closed  Awaiting ride

## 2019-10-14 ENCOUNTER — Other Ambulatory Visit: Payer: Self-pay

## 2019-10-14 ENCOUNTER — Other Ambulatory Visit: Payer: Self-pay | Admitting: Internal Medicine

## 2019-10-14 ENCOUNTER — Ambulatory Visit
Admission: RE | Admit: 2019-10-14 | Discharge: 2019-10-14 | Disposition: A | Discharge status: Home / Self Care | Payer: Disability Insurance | Attending: Internal Medicine | Admitting: Internal Medicine

## 2019-10-14 ENCOUNTER — Ambulatory Visit
Admission: RE | Admit: 2019-10-14 | Discharge: 2019-10-14 | Disposition: A | Discharge status: Home / Self Care | Payer: Disability Insurance | Source: Ambulatory Visit | Attending: Internal Medicine | Admitting: Internal Medicine

## 2019-10-14 DIAGNOSIS — M069 Rheumatoid arthritis, unspecified: Secondary | ICD-10-CM

## 2020-08-15 ENCOUNTER — Other Ambulatory Visit: Payer: Self-pay

## 2020-08-15 ENCOUNTER — Encounter: Payer: Self-pay | Admitting: Emergency Medicine

## 2020-08-15 ENCOUNTER — Emergency Department
Admission: EM | Admit: 2020-08-15 | Discharge: 2020-08-15 | Disposition: A | Discharge status: Home / Self Care | Payer: Disability Insurance | Attending: Emergency Medicine | Admitting: Emergency Medicine

## 2020-08-15 DIAGNOSIS — F15950 Other stimulant use, unspecified with stimulant-induced psychotic disorder with delusions: Secondary | ICD-10-CM

## 2020-08-15 DIAGNOSIS — I10 Essential (primary) hypertension: Secondary | ICD-10-CM | POA: Insufficient documentation

## 2020-08-15 DIAGNOSIS — F1010 Alcohol abuse, uncomplicated: Secondary | ICD-10-CM

## 2020-08-15 DIAGNOSIS — Z20822 Contact with and (suspected) exposure to covid-19: Secondary | ICD-10-CM | POA: Insufficient documentation

## 2020-08-15 DIAGNOSIS — F15150 Other stimulant abuse with stimulant-induced psychotic disorder with delusions: Secondary | ICD-10-CM | POA: Insufficient documentation

## 2020-08-15 DIAGNOSIS — Y900 Blood alcohol level of less than 20 mg/100 ml: Secondary | ICD-10-CM | POA: Insufficient documentation

## 2020-08-15 DIAGNOSIS — F1410 Cocaine abuse, uncomplicated: Secondary | ICD-10-CM

## 2020-08-15 DIAGNOSIS — F1910 Other psychoactive substance abuse, uncomplicated: Secondary | ICD-10-CM

## 2020-08-15 DIAGNOSIS — F1510 Other stimulant abuse, uncomplicated: Secondary | ICD-10-CM

## 2020-08-15 DIAGNOSIS — R45851 Suicidal ideations: Secondary | ICD-10-CM | POA: Insufficient documentation

## 2020-08-15 DIAGNOSIS — F17200 Nicotine dependence, unspecified, uncomplicated: Secondary | ICD-10-CM | POA: Insufficient documentation

## 2020-08-15 HISTORY — DX: Essential (primary) hypertension: I10

## 2020-08-15 LAB — COMPREHENSIVE METABOLIC PANEL
ALT: 21 U/L (ref 0–44)
AST: 25 U/L (ref 15–41)
Albumin: 4.5 g/dL (ref 3.5–5.0)
Alkaline Phosphatase: 62 U/L (ref 38–126)
Anion gap: 12 (ref 5–15)
BUN: 17 mg/dL (ref 8–23)
CO2: 26 mmol/L (ref 22–32)
Calcium: 9.1 mg/dL (ref 8.9–10.3)
Chloride: 102 mmol/L (ref 98–111)
Creatinine, Ser: 0.99 mg/dL (ref 0.61–1.24)
GFR, Estimated: >60 mL/min (ref >60)
Glucose, Bld: 162 mg/dL — ABNORMAL HIGH (ref 70–99)
Potassium: 3.8 mmol/L (ref 3.5–5.1)
Sodium: 140 mmol/L (ref 135–145)
Total Bilirubin: 1.8 mg/dL — ABNORMAL HIGH (ref 0.3–1.2)
Total Protein: 7.8 g/dL (ref 6.5–8.1)

## 2020-08-15 LAB — URINE DRUG SCREEN, QUALITATIVE (ARMC ONLY)
Amphetamines, Ur Screen: POSITIVE — AB
Barbiturates, Ur Screen: NOT DETECTED
Benzodiazepine, Ur Scrn: NOT DETECTED
Cannabinoid 50 Ng, Ur ~~LOC~~: POSITIVE — AB
Cocaine Metabolite,Ur ~~LOC~~: POSITIVE — AB
MDMA (Ecstasy)Ur Screen: NOT DETECTED
Methadone Scn, Ur: NOT DETECTED
Opiate, Ur Screen: NOT DETECTED
Phencyclidine (PCP) Ur S: NOT DETECTED
Tricyclic, Ur Screen: NOT DETECTED

## 2020-08-15 LAB — CBC
HCT: 44.5 % (ref 39.0–52.0)
Hemoglobin: 15.1 g/dL (ref 13.0–17.0)
MCH: 30.3 pg (ref 26.0–34.0)
MCHC: 33.9 g/dL (ref 30.0–36.0)
MCV: 89.4 fL (ref 80.0–100.0)
Platelets: 316 10*3/uL (ref 150–400)
RBC: 4.98 MIL/uL (ref 4.22–5.81)
RDW: 14.3 % (ref 11.5–15.5)
WBC: 10.1 10*3/uL (ref 4.0–10.5)
nRBC: 0 % (ref 0.0–0.2)

## 2020-08-15 LAB — ACETAMINOPHEN LEVEL: Acetaminophen (Tylenol), Serum: <10 ug/mL — ABNORMAL LOW (ref 10–30)

## 2020-08-15 LAB — RESP PANEL BY RT-PCR (FLU A&B, COVID) ARPGX2
Influenza A by PCR: NEGATIVE
Influenza B by PCR: NEGATIVE
SARS Coronavirus 2 by RT PCR: NEGATIVE

## 2020-08-15 LAB — SALICYLATE LEVEL: Salicylate Lvl: <7 mg/dL — ABNORMAL LOW (ref 7.0–30.0)

## 2020-08-15 LAB — ETHANOL: Alcohol, Ethyl (B): <10 mg/dL (ref <10)

## 2020-08-15 MED ORDER — ACETAMINOPHEN 500 MG PO TABS
1000.0000 mg | ORAL_TABLET | Freq: Once | ORAL | Status: AC
  Administered 2020-08-15: 1000 mg via ORAL
  Filled 2020-08-15: qty 2

## 2020-08-15 NOTE — ED Notes (Signed)
Pt in shower.  
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
## 2020-08-15 NOTE — ED Notes (Signed)
E-signature not working at this time. Pt verbalized understanding of D/C instructions, prescriptions and follow up care with no further questions at this time. Pt in NAD and ambulatory at time of D/C.  
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
## 2020-08-15 NOTE — ED Notes (Signed)
IVC  PAPERS  RESCINDED  PER  DR  CLAPACS  MD 
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
## 2020-08-15 NOTE — ED Notes (Signed)
Given  breakfast

## 2020-08-15 NOTE — ED Notes (Signed)
Psychiatrist at bedside

## 2020-08-15 NOTE — ED Notes (Signed)
Report received from [place], English as a second language teacher. Patient alert and oriented, warm and dry, and in no acute distress. Patient denies SI, HI, AVH and pain. Patient made aware of Q15 minute rounds and Psychologist, counselling presence for their safety. Patient instructed to come to this nurse with needs or concerns.

## 2020-08-15 NOTE — ED Notes (Signed)
IVC PENDING  CONSULT ?

## 2021-05-09 IMAGING — CT CT HEAD W/O CM
3 series · 16 of 47 positions shown, 19 images · non-contrast
Comparison: 07/01/2010

CLINICAL DATA: Fall this morning, head trauma, headache.

EXAM:
CT HEAD WITHOUT CONTRAST
TECHNIQUE: Contiguous axial images were obtained from the base of the skull
through the vertex without intravenous contrast.

[Series 2: head wo · axial · 0.43mm/px · z∈[-148,-18]mm · 10 of 32 slices shown, 13 images]
[im 3/32  brain]
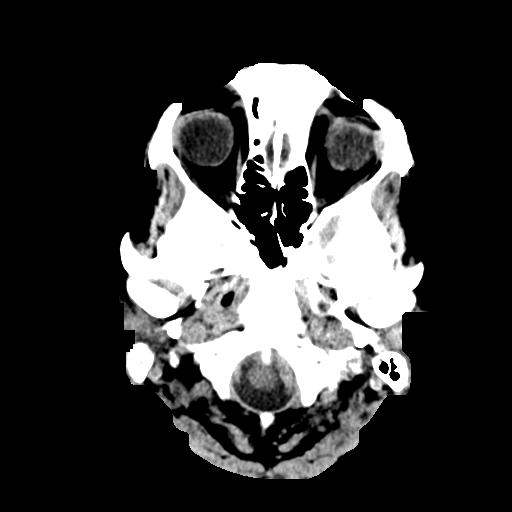
[im 3/32  bone]
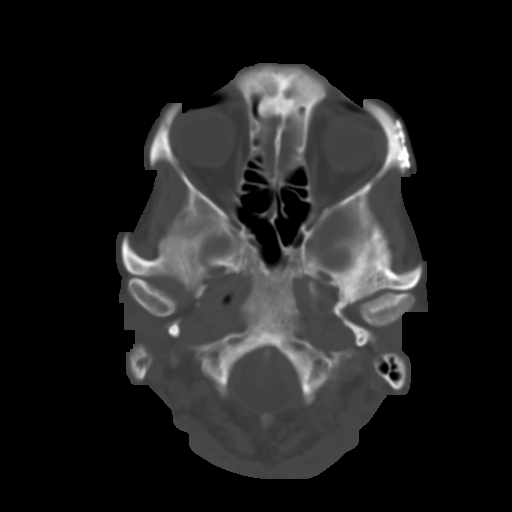
[im 6/32  brain]
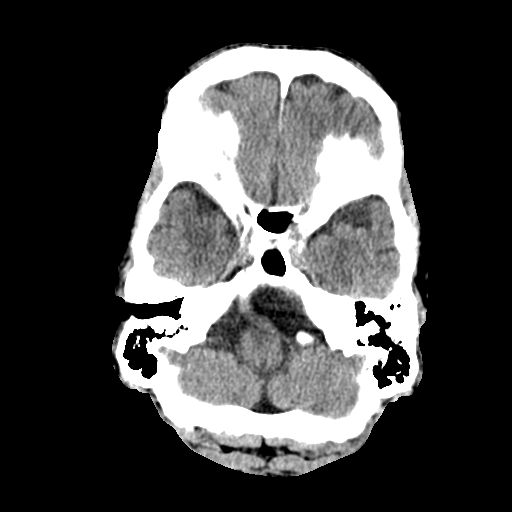
[im 9/32  brain]
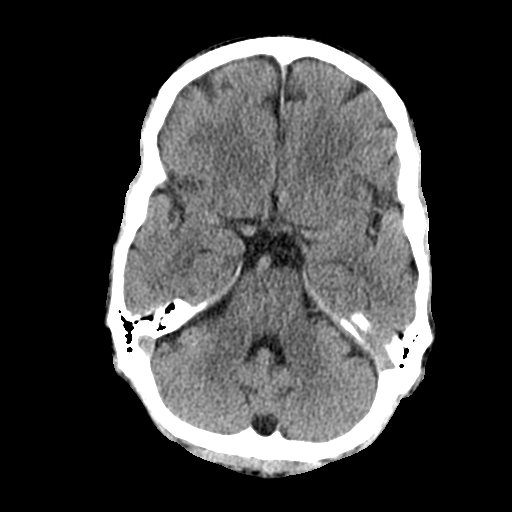
[im 11/32  brain]
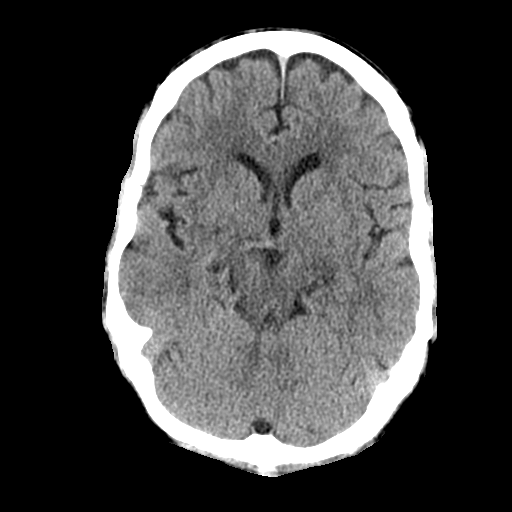
[im 14/32  brain]
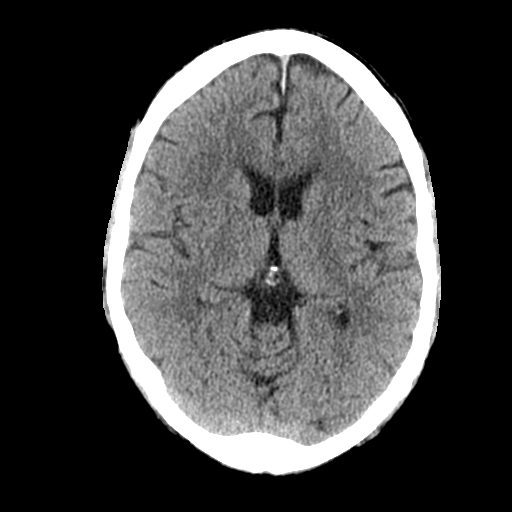
[im 14/32  bone]
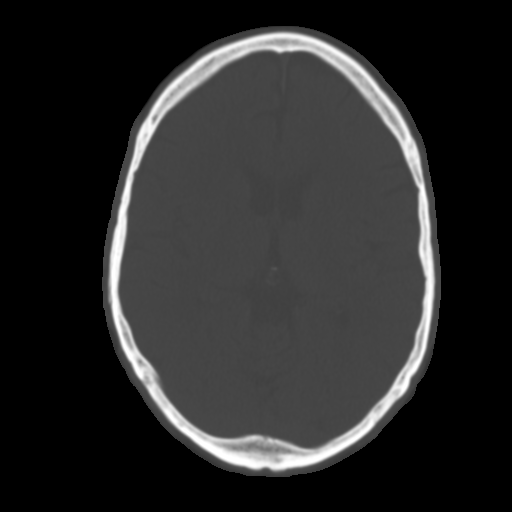
[im 18/32  brain]
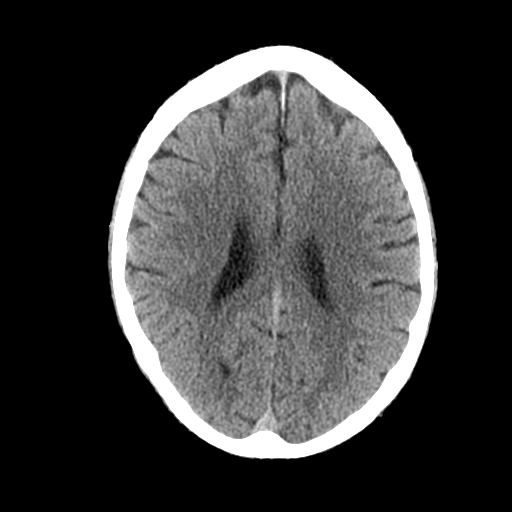
[im 21/32  brain]
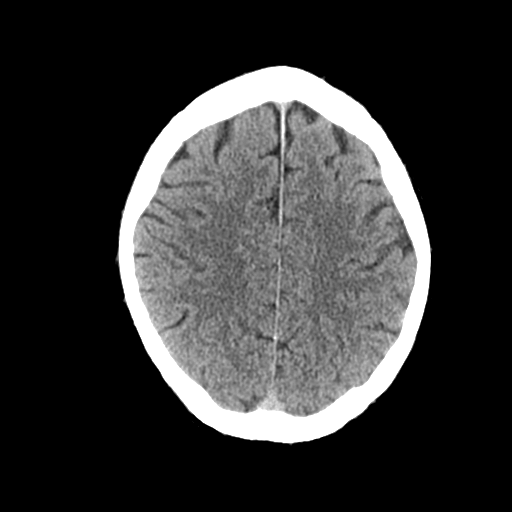
[im 24/32  brain]
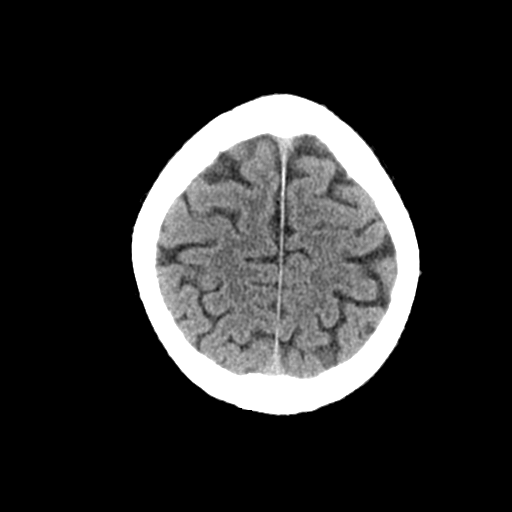
[im 26/32  brain]
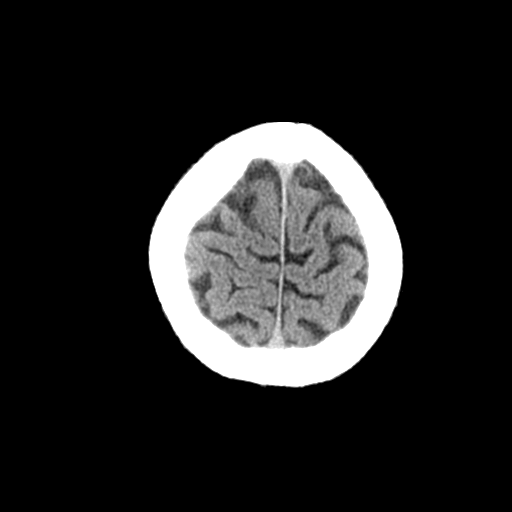
[im 26/32  bone]
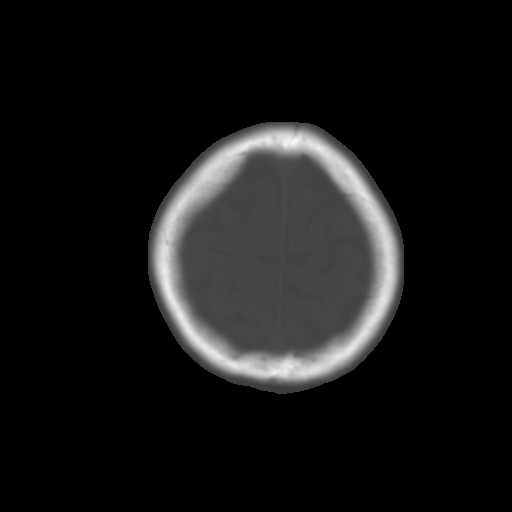
[im 29/32  brain]
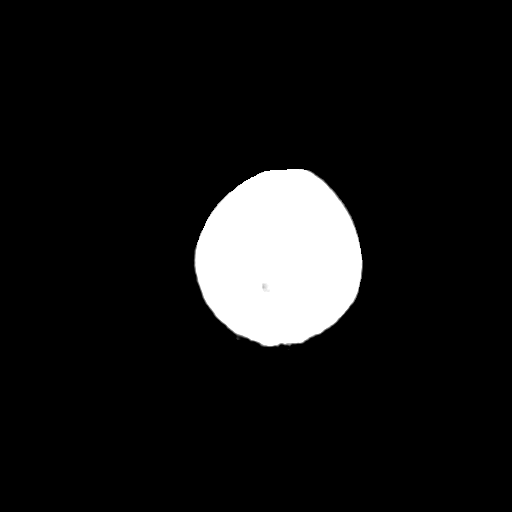

[Series 4: coronal soft tissue · coronal · 0.33mm/px · 3 of 69 slices shown]
[im 23/69  brain]
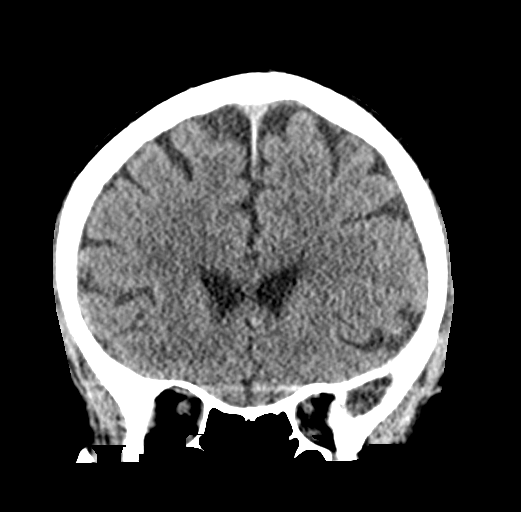
[im 31/69  brain]
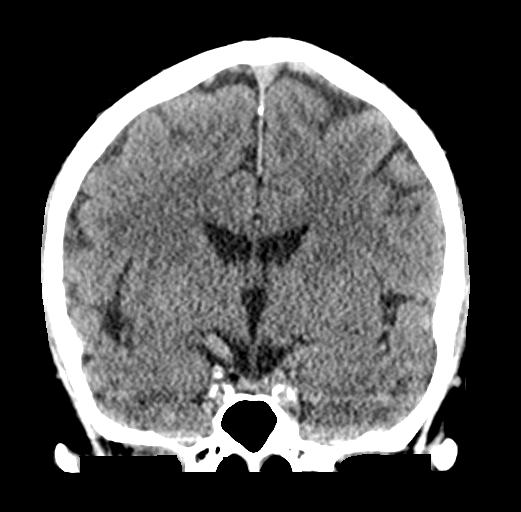
[im 38/69  brain]
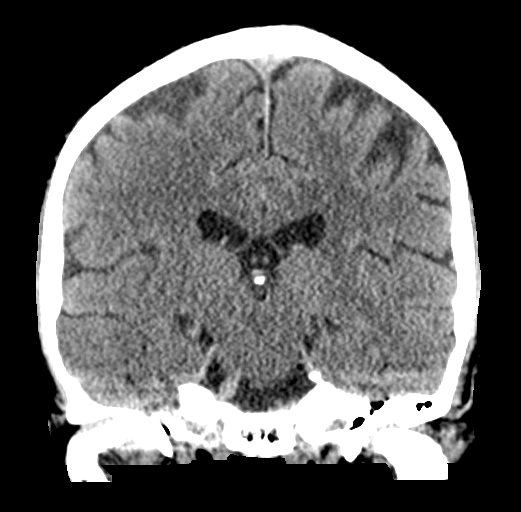

[Series 5: sagittal soft tissue · sagittal · 0.33mm/px · 3 of 54 slices shown]
[im 18/54  brain]
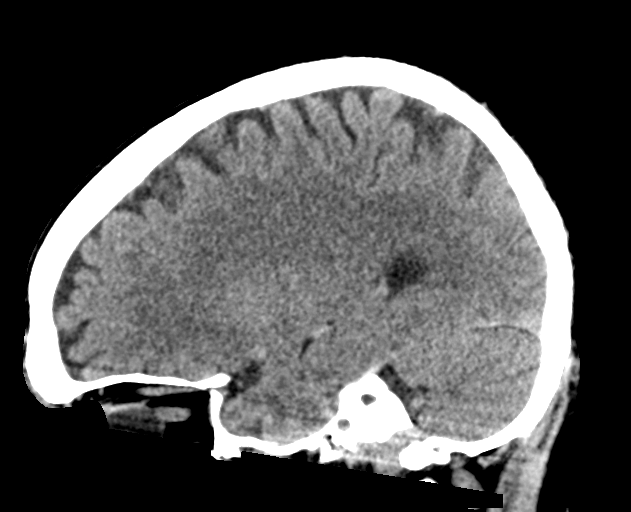
[im 27/54  brain]
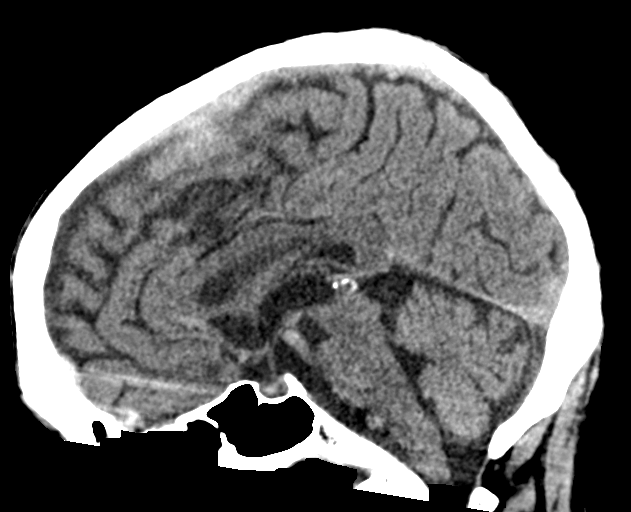
[im 36/54  brain]
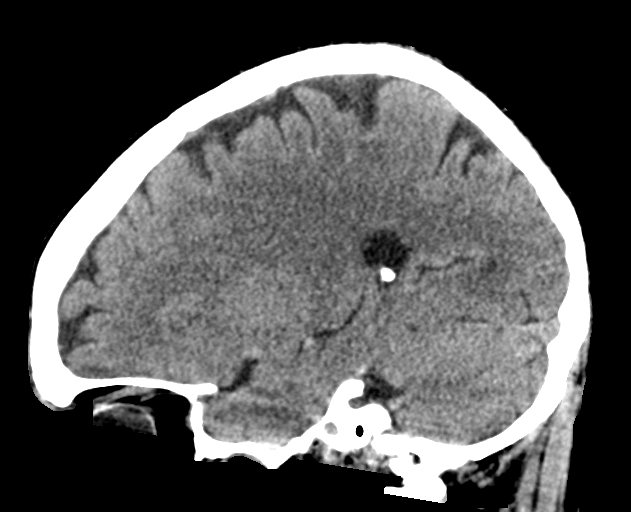

[16 of 47 positions shown; findings below may reference images not displayed]

FINDINGS: Brain: The brainstem, cerebellum, cerebral peduncles, thalami, basal
ganglia, basilar cisterns, and ventricular system appear within
normal limits. No intracranial hemorrhage, mass lesion, or acute
CVA.

Vascular: There is atherosclerotic calcification of the cavernous
carotid arteries bilaterally.

Skull: Small plate and screw fixator along the left upper lateral
orbit.

Sinuses/Orbits: Chronic ethmoid and frontal sinusitis.

Other: No supplemental non-categorized findings.
IMPRESSION: 1. No acute intracranial findings.
2. Chronic ethmoid and frontal sinusitis.

## 2021-05-09 IMAGING — CR DG CERVICAL SPINE 2 OR 3 VIEWS
6 series · 6 of 6 positions shown · non-contrast
Comparison: None.

CLINICAL DATA: Pt fell this morning and is having pain on the
inside of his right knee and left side of his neck. Previous
fracture surgery in the face.

EXAM:
CERVICAL SPINE - 2-3 VIEW

[c-spine lat]
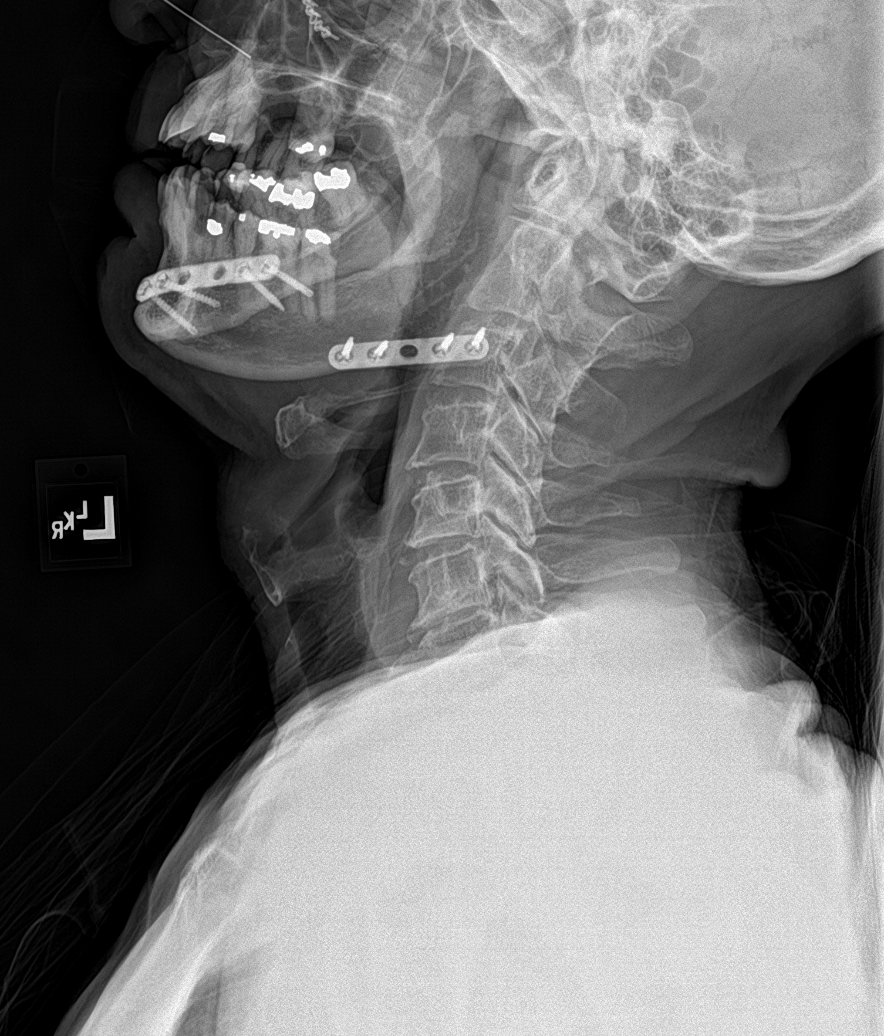

[c-spine ap]
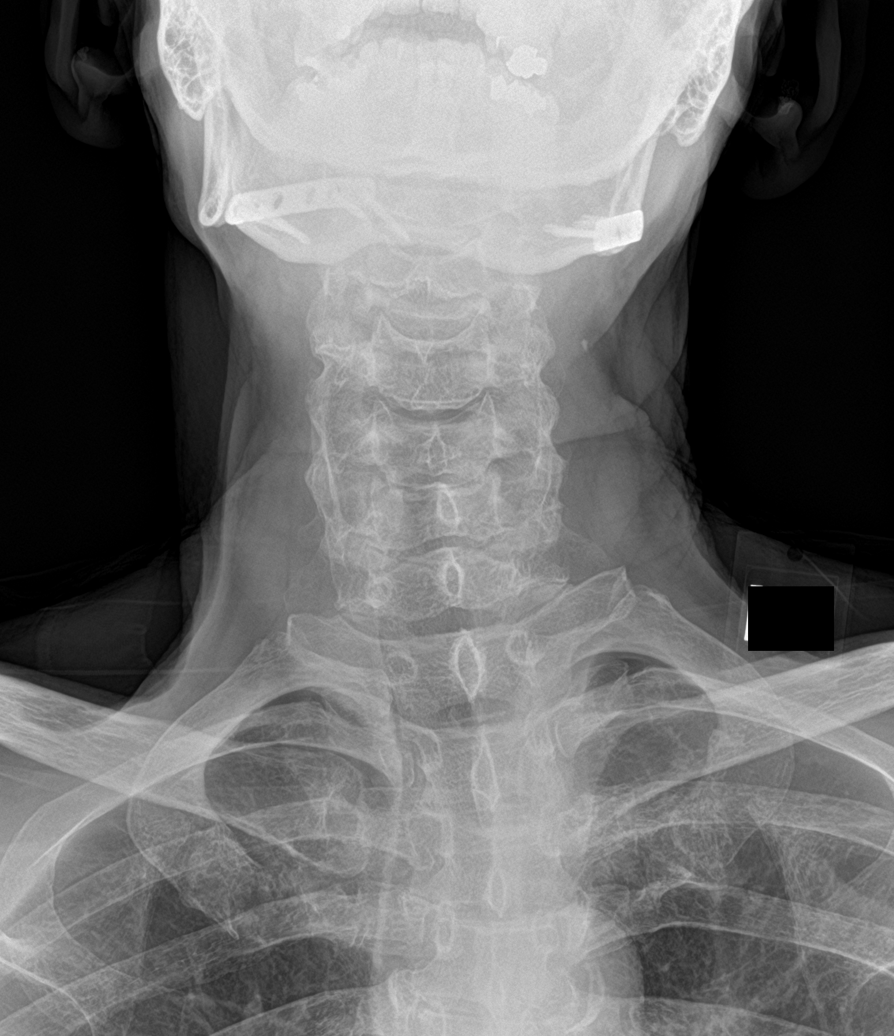

[c-spine open mouth (1 of 2)]
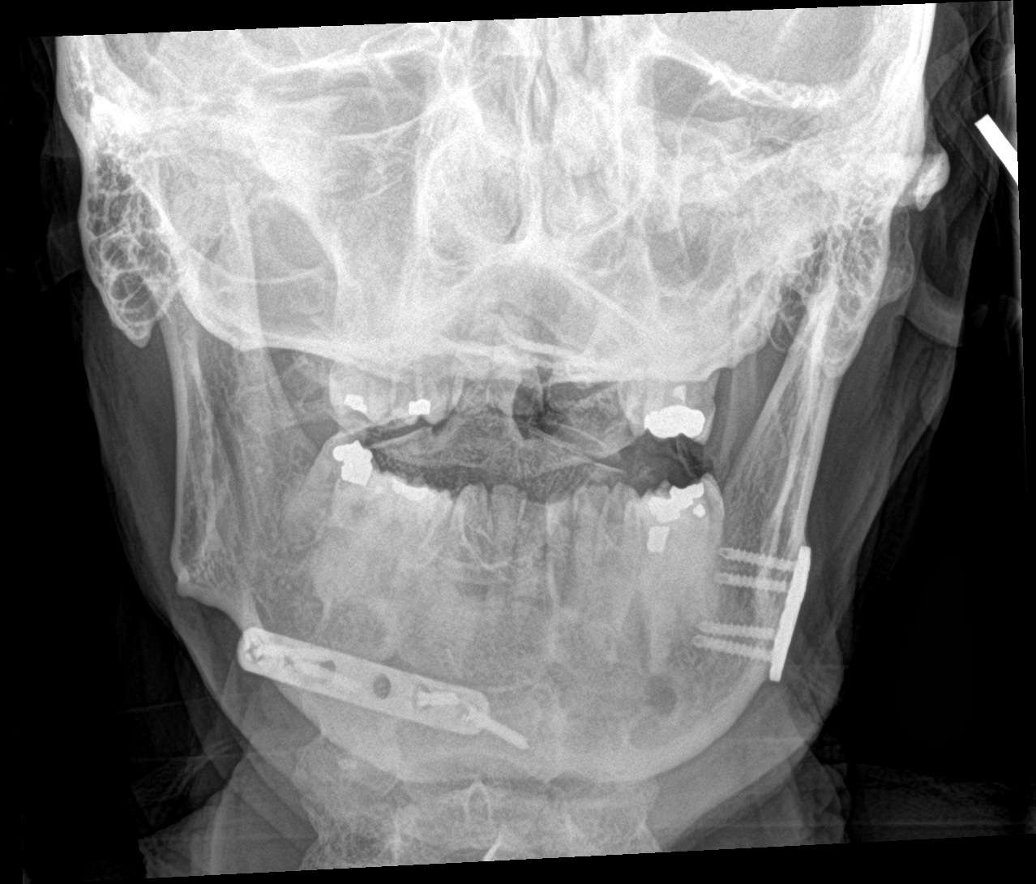

[c-spine swimmers]
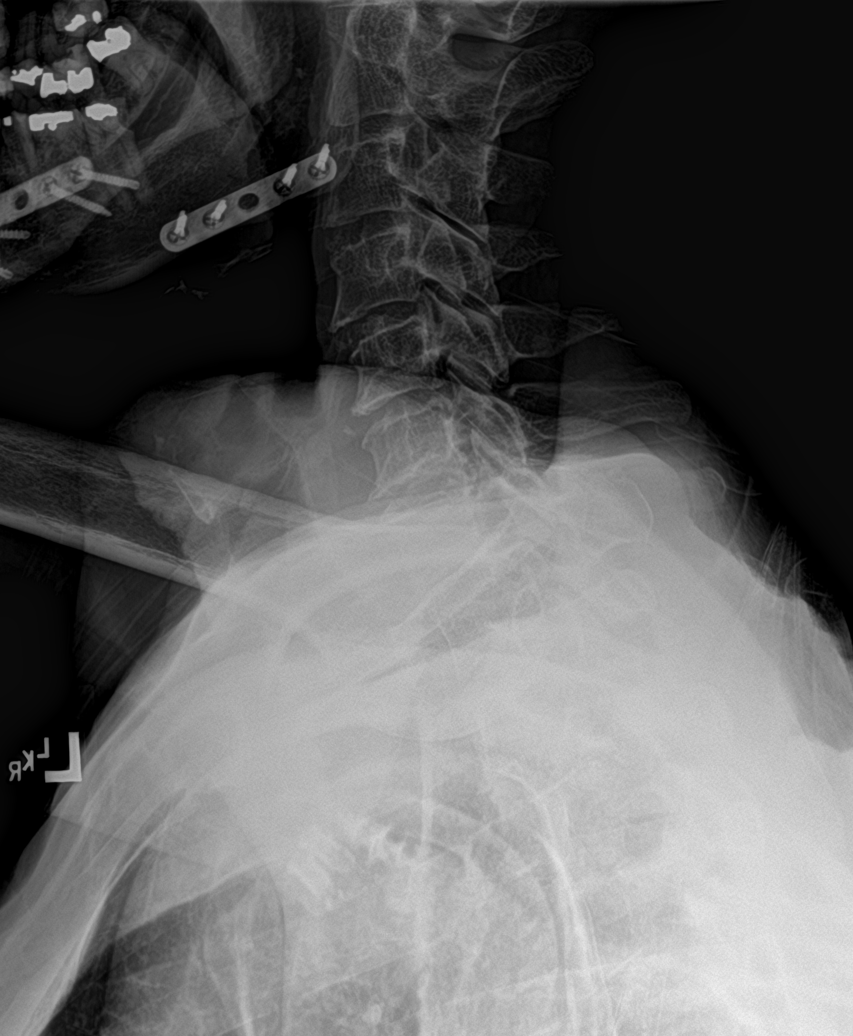

[[person_name]]
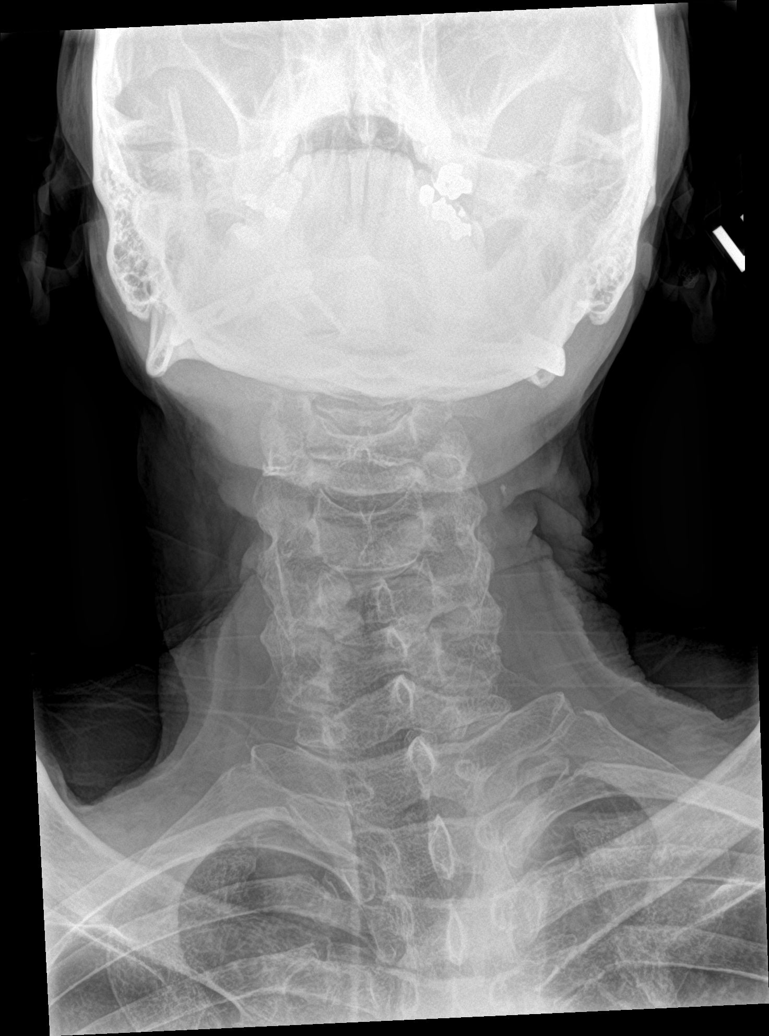

[c-spine open mouth (2 of 2)]
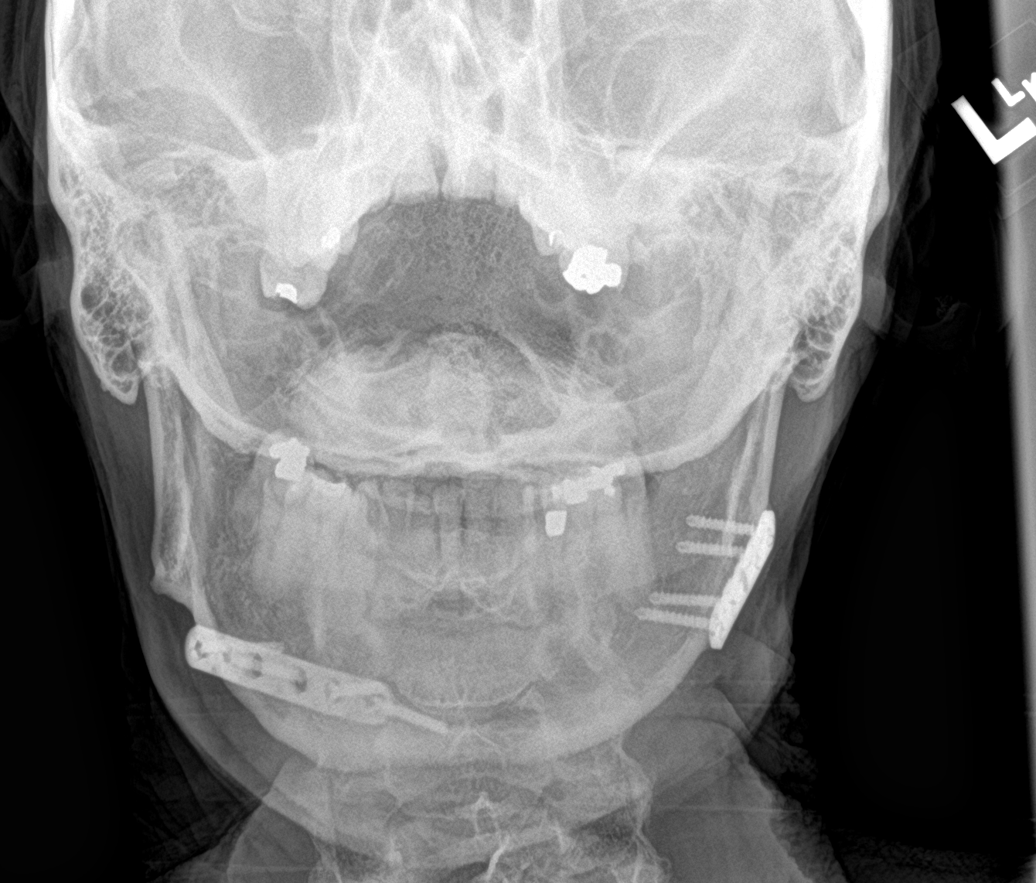

[6 of 6 positions shown; findings below may reference images not displayed]

FINDINGS: No fracture, bone lesion or spondylolisthesis.

Mild loss of disc height at C4-C5 and C5-C6 with mild endplate
spurring. Remaining cervical discs are well preserved in height.

Skeletal structures are demineralized.

There are changes from the previous ORIF of mandibular fractures.

Soft tissues are unremarkable.
IMPRESSION: 1. No fracture or acute finding.  No spondylolisthesis.
2. Mild disc degenerative changes at C5-C6 and C6-C7.

## 2021-05-09 IMAGING — CR DG KNEE COMPLETE 4+V*R*
4 series · 4 of 4 positions shown · non-contrast
Comparison: None.

CLINICAL DATA: Pt fell this morning and is having pain on the
inside of his right knee and left side of his neck. Previous
fracture surgery in the face.

EXAM:
RIGHT KNEE - COMPLETE 4+ VIEW

[knee ap]
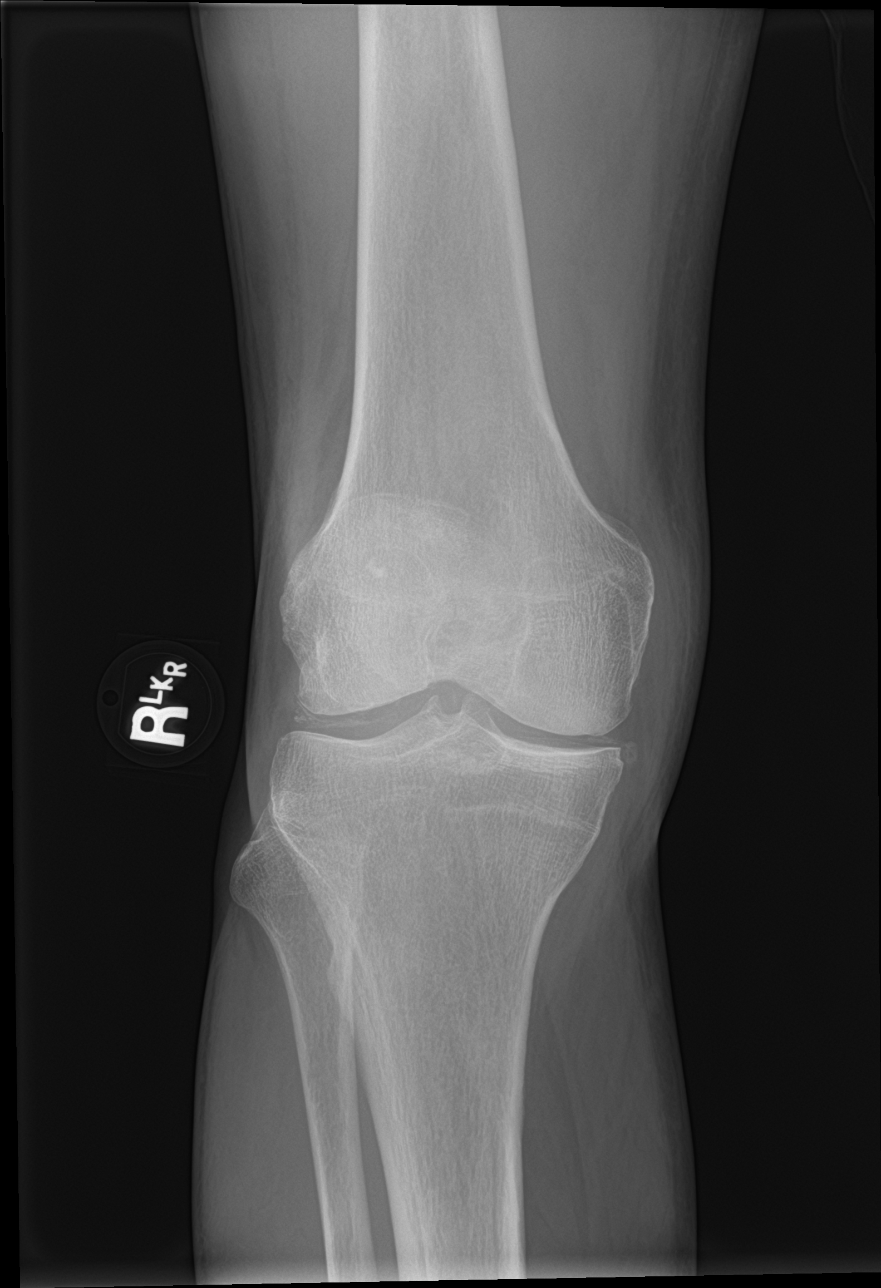

[knee obl (1 of 2)]
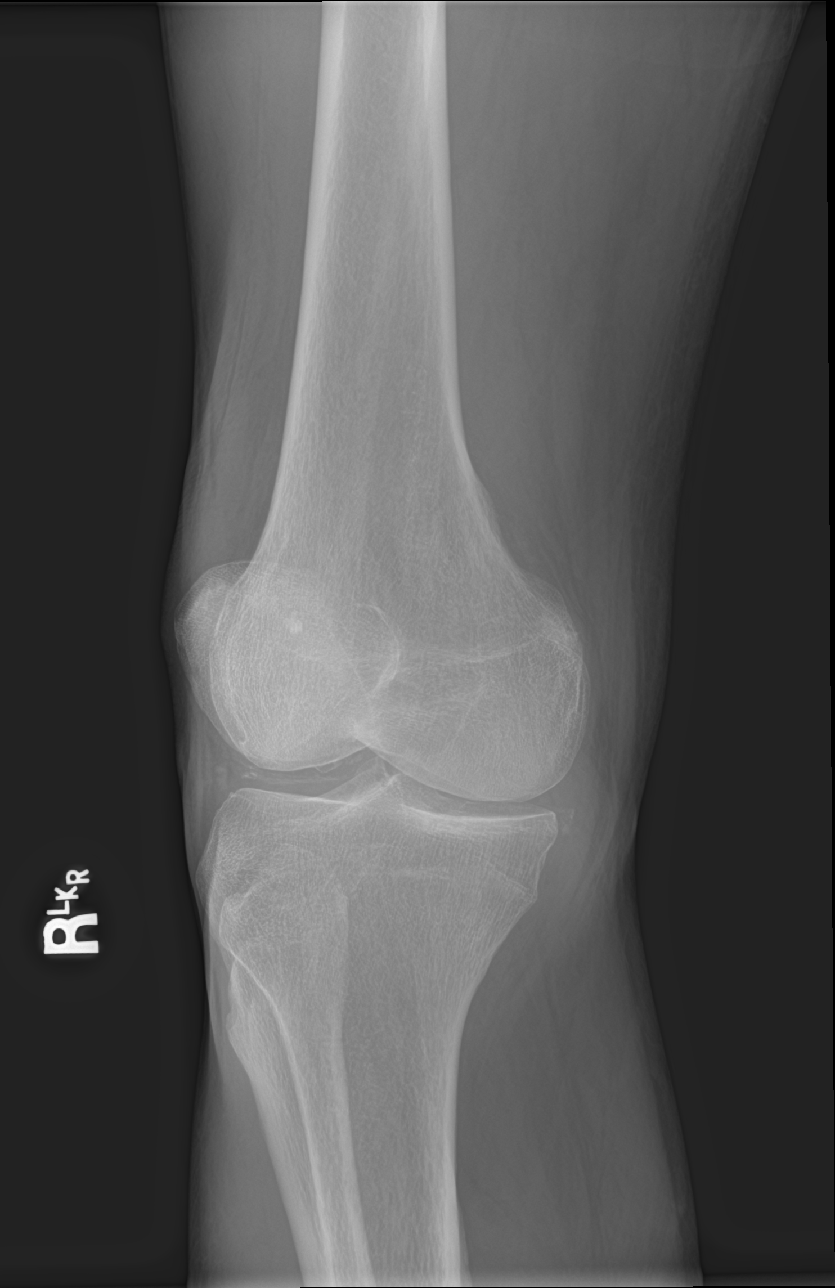

[knee obl (2 of 2)]
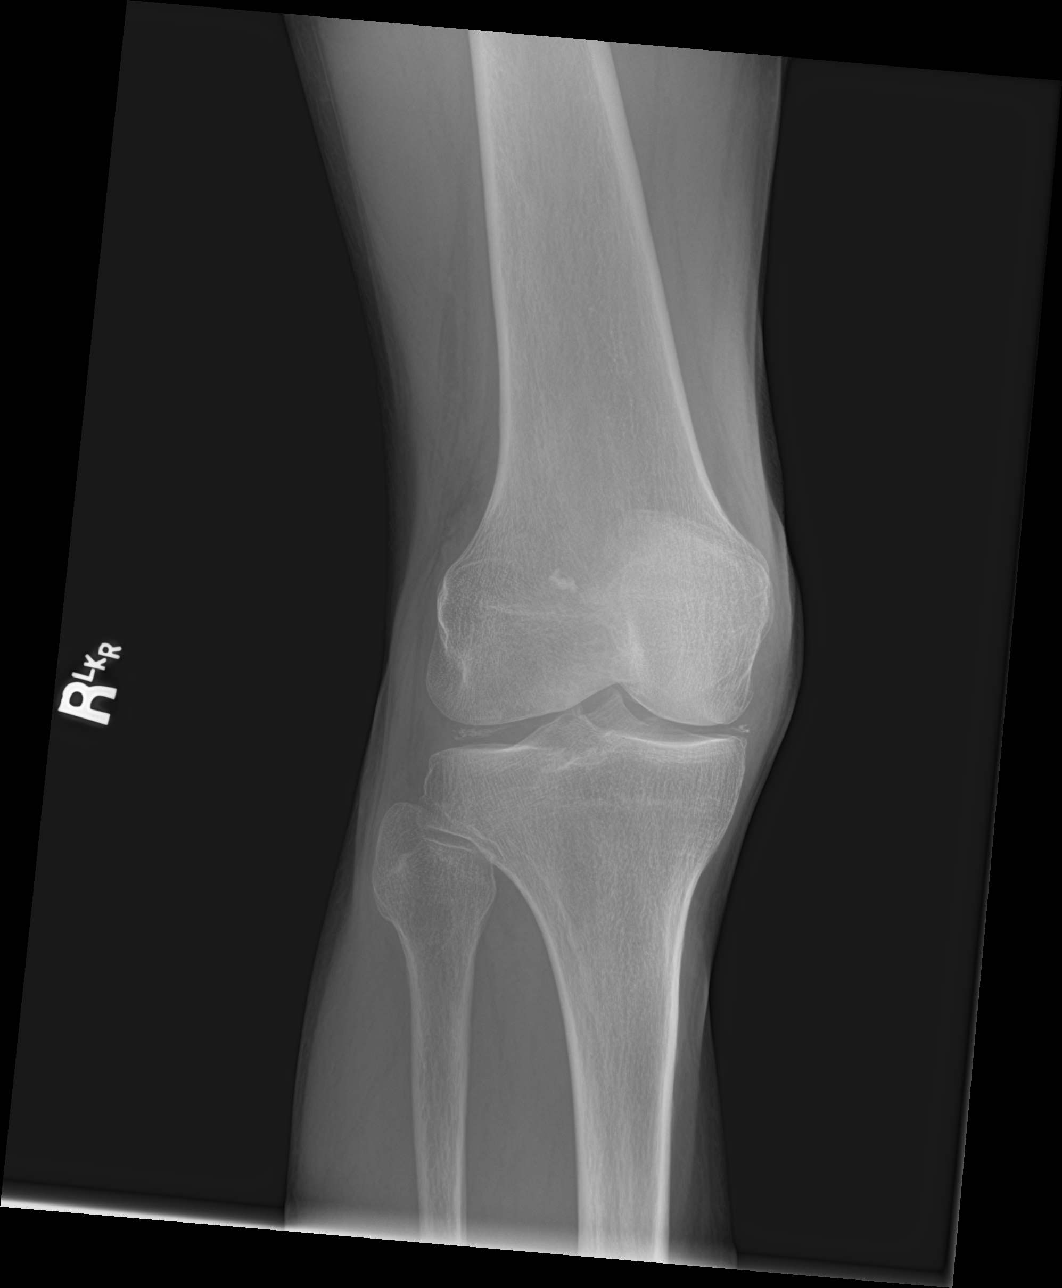

[knee lat]
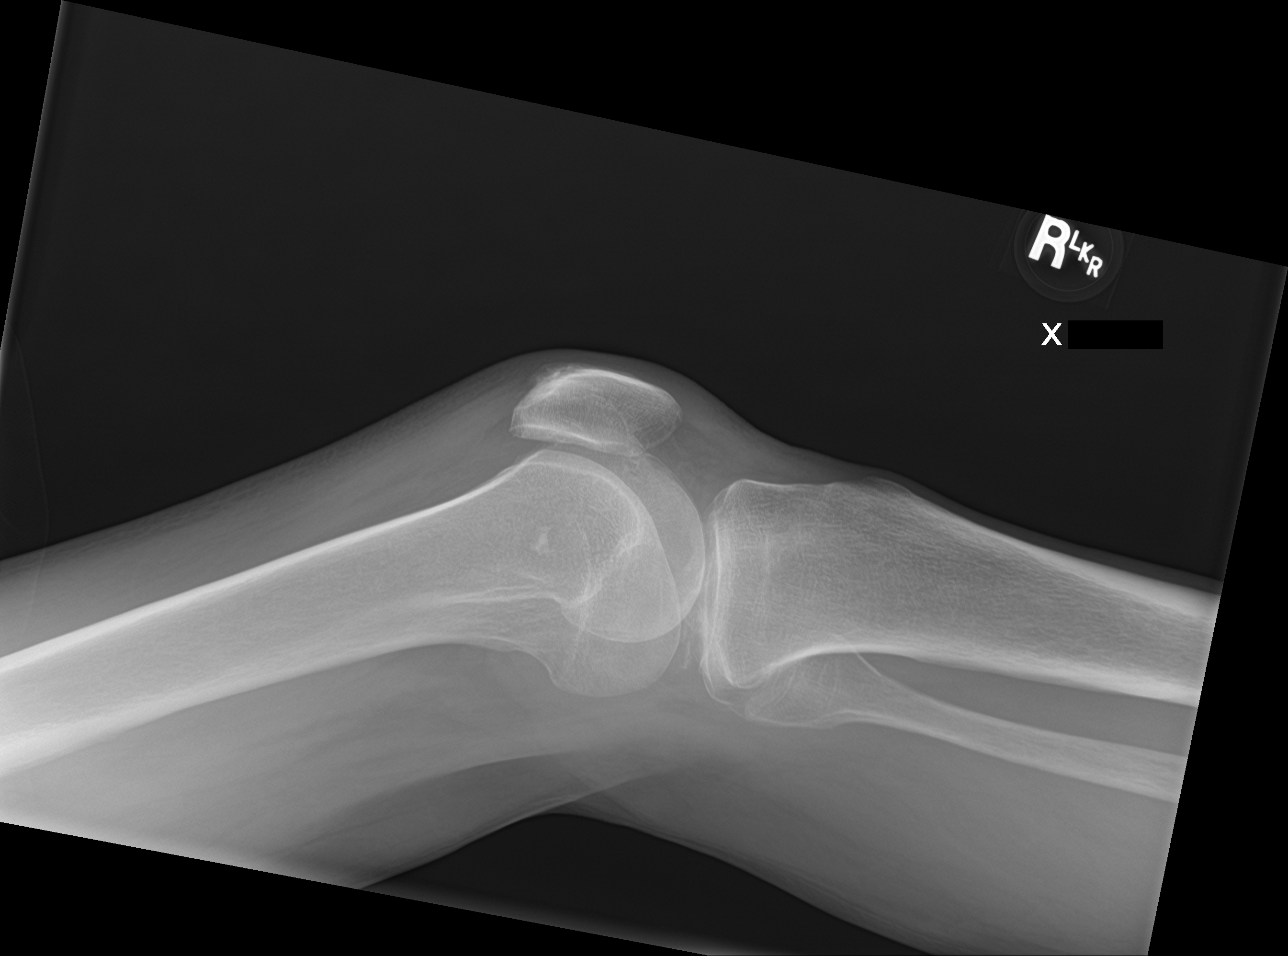

[4 of 4 positions shown; findings below may reference images not displayed]

FINDINGS: No fracture or bone lesion.

Mild medial joint space compartment narrowing. Chondrocalcinosis
noted along the menisci. No other arthropathic changes. No joint
effusion.

Normal soft tissues.
IMPRESSION: 1. No fracture or acute finding.
2. Minor degenerative changes.

## 2021-09-23 ENCOUNTER — Emergency Department
Admission: EM | Admit: 2021-09-23 | Discharge: 2021-09-25 | Disposition: A | Discharge status: Rehabilitation Facility | Payer: No Typology Code available for payment source | Attending: Emergency Medicine | Admitting: Emergency Medicine

## 2021-09-23 ENCOUNTER — Other Ambulatory Visit: Payer: Self-pay

## 2021-09-23 DIAGNOSIS — Z20822 Contact with and (suspected) exposure to covid-19: Secondary | ICD-10-CM | POA: Insufficient documentation

## 2021-09-23 DIAGNOSIS — F32A Depression, unspecified: Secondary | ICD-10-CM | POA: Insufficient documentation

## 2021-09-23 DIAGNOSIS — F1910 Other psychoactive substance abuse, uncomplicated: Secondary | ICD-10-CM | POA: Insufficient documentation

## 2021-09-23 DIAGNOSIS — F1410 Cocaine abuse, uncomplicated: Secondary | ICD-10-CM | POA: Diagnosis present

## 2021-09-23 DIAGNOSIS — R45851 Suicidal ideations: Secondary | ICD-10-CM | POA: Insufficient documentation

## 2021-09-23 DIAGNOSIS — R531 Weakness: Secondary | ICD-10-CM | POA: Diagnosis present

## 2021-09-23 DIAGNOSIS — I10 Essential (primary) hypertension: Secondary | ICD-10-CM | POA: Diagnosis not present

## 2021-09-23 LAB — CBC
HCT: 45.5 % (ref 39.0–52.0)
Hemoglobin: 14.9 g/dL (ref 13.0–17.0)
MCH: 29.4 pg (ref 26.0–34.0)
MCHC: 32.7 g/dL (ref 30.0–36.0)
MCV: 89.9 fL (ref 80.0–100.0)
Platelets: 315 10*3/uL (ref 150–400)
RBC: 5.06 MIL/uL (ref 4.22–5.81)
RDW: 13.3 % (ref 11.5–15.5)
WBC: 8.5 10*3/uL (ref 4.0–10.5)
nRBC: 0 % (ref 0.0–0.2)

## 2021-09-23 LAB — COMPREHENSIVE METABOLIC PANEL
ALT: 15 U/L (ref 0–44)
AST: 22 U/L (ref 15–41)
Albumin: 3.9 g/dL (ref 3.5–5.0)
Alkaline Phosphatase: 79 U/L (ref 38–126)
Anion gap: 10 (ref 5–15)
BUN: 13 mg/dL (ref 8–23)
CO2: 28 mmol/L (ref 22–32)
Calcium: 9.1 mg/dL (ref 8.9–10.3)
Chloride: 102 mmol/L (ref 98–111)
Creatinine, Ser: 0.67 mg/dL (ref 0.61–1.24)
GFR, Estimated: >60 mL/min (ref >60)
Glucose, Bld: 141 mg/dL — ABNORMAL HIGH (ref 70–99)
Potassium: 3.5 mmol/L (ref 3.5–5.1)
Sodium: 140 mmol/L (ref 135–145)
Total Bilirubin: 1.5 mg/dL — ABNORMAL HIGH (ref 0.3–1.2)
Total Protein: 7.1 g/dL (ref 6.5–8.1)

## 2021-09-23 LAB — ACETAMINOPHEN LEVEL: Acetaminophen (Tylenol), Serum: <10 ug/mL — ABNORMAL LOW (ref 10–30)

## 2021-09-23 LAB — SALICYLATE LEVEL: Salicylate Lvl: <7 mg/dL — ABNORMAL LOW (ref 7.0–30.0)

## 2021-09-23 LAB — ETHANOL: Alcohol, Ethyl (B): <10 mg/dL (ref <10)

## 2021-09-23 MED ORDER — LORAZEPAM 1 MG PO TABS
1.0000 mg | ORAL_TABLET | Freq: Once | ORAL | Status: AC
  Administered 2021-09-23: 1 mg via ORAL
  Filled 2021-09-23: qty 1

## 2021-09-23 NOTE — ED Notes (Signed)
Belongings secured at the nurses station. Black sandals, black boxers, jeans, black T-shirt, black socks, and wrist watch removed. Glasses remain w/ pt. Pt changed into wine scrubs, hospital socks, and hospital underwear. Pt given urinal and instructed on use for sample. Pt given lunch box and water per his request- states he has not eaten anything today.

## 2021-09-23 NOTE — ED Provider Notes (Signed)
   [facility] Provider Note    Event Date/Time   First MD Initiated Contact with Patient 09/23/21 1930     (approximate)  History   Chief Complaint: Suicidal  HPI  Jorge Maldonado is a 66 y.o. male with a past medical history of hypertension, substance abuse presents to the emergency department for withdrawal and depression.  According to the patient he had been using heroin, last use was yesterday.  Patient states today he feels weak and shaky.  Thinks he is going into withdrawals.  States he is depressed because he keeps using substances has passive SI but no active plan to hurt himself and wishes to be here voluntarily.   Physical Exam   Triage Vital Signs: ED Triage Vitals [09/23/21 1823]  Enc Vitals Group     BP (!) 162/92     Pulse Rate 67     Resp (!) 22     Temp 98.4 F (36.9 C)     Temp Source Oral     SpO2 97 %     Weight      Height      Head Circumference      Peak Flow      Pain Score 9     Pain Loc      Pain Edu?      Excl. in GC?     Most recent vital signs: Vitals:   09/23/21 1823  BP: (!) 162/92  Pulse: 67  Resp: (!) 22  Temp: 98.4 F (36.9 C)  SpO2: 97%    General: Patient is sleeping calmly upon my evaluation.  Wakes easily to voice and answers questions appropriately. CV:  Good peripheral perfusion.  Regular rate and rhythm  Resp:  Normal effort.  Equal breath sounds bilaterally.  Abd:  No distention.  Soft, nontender.  No rebound or guarding.   ED Results / Procedures / Treatments   MEDICATIONS ORDERED IN ED: Medications - No data to display   IMPRESSION / MDM / ASSESSMENT AND PLAN / ED COURSE  I reviewed the triage vital signs and the nursing notes.  Patient's presentation is most consistent with acute presentation with potential threat to life or bodily function.  Patient presents to the emergency department for depression and substance abuse.  Patient states he has been using heroin, last use was around  10 AM yesterday.  Patient believes he is now going into withdrawals.  However during my evaluation patient is calmly sleeping awakens easily to voice answers questions appropriate.  Patient is mildly hypertensive otherwise reassuring vitals.  No concerning findings on physical exam.  Patient states passive SI/depression but no active plan to hurt himself and patient is here voluntarily looking for help.  We will keep the patient voluntary, have psychiatry evaluate.  Patient's labs thus far reassuring occluding normal CBC, negative acetaminophen salicylate and alcohol.  Chemistry is reassuring.  Psychiatry and TTS evaluations pending.  FINAL CLINICAL IMPRESSION(S) / ED DIAGNOSES   Substance abuse Depression   Note:  This document was prepared using Dragon voice recognition software and may include unintentional dictation errors.   Minna Antis, MD 09/23/21 2044

## 2021-09-23 NOTE — ED Triage Notes (Signed)
Per EMS, pt was endorsing SI due to heroin withdrawal today. Pt states he last used heroin yesterday at 10 am and c/o malaise today- headache, body ache, nausea, weakness. Vitals WNL for ems. Pt is AOX4, appears uncomfortable and slightly diaphoretic.

## 2021-09-23 NOTE — ED Notes (Signed)
Patient room searched by this RN, [employer] ED tech,[employer].  Patient wanded and patted down by security.  Bed linen removed and searched, mattress removed and searched, garage door lifted and area searched.  No other contraband found.

## 2021-09-23 NOTE — ED Notes (Signed)
MD into see patient.  MD then notified this RN that there was a crack pipe on patients bed.  RN and Office duty sheriff deupty into room and pipe removed and disposed in sharps box.  Charge nurse notified.

## 2021-09-23 NOTE — ED Notes (Signed)
Pt is resting comfortably with no signs of acute distress.

## 2021-09-24 DIAGNOSIS — F1410 Cocaine abuse, uncomplicated: Secondary | ICD-10-CM

## 2021-09-24 LAB — RESP PANEL BY RT-PCR (FLU A&B, COVID) ARPGX2
Influenza A by PCR: NEGATIVE
Influenza B by PCR: NEGATIVE
SARS Coronavirus 2 by RT PCR: NEGATIVE

## 2021-09-24 LAB — URINE DRUG SCREEN, QUALITATIVE (ARMC ONLY)
Amphetamines, Ur Screen: NOT DETECTED
Barbiturates, Ur Screen: NOT DETECTED
Benzodiazepine, Ur Scrn: NOT DETECTED
Cannabinoid 50 Ng, Ur ~~LOC~~: NOT DETECTED
Cocaine Metabolite,Ur ~~LOC~~: POSITIVE — AB
MDMA (Ecstasy)Ur Screen: NOT DETECTED
Methadone Scn, Ur: NOT DETECTED
Opiate, Ur Screen: POSITIVE — AB
Phencyclidine (PCP) Ur S: NOT DETECTED
Tricyclic, Ur Screen: NOT DETECTED

## 2021-09-24 MED ORDER — DICYCLOMINE HCL 20 MG PO TABS: 20.0000 mg | ORAL_TABLET | Freq: Four times a day (QID) | ORAL | Status: DC | PRN

## 2021-09-24 MED ORDER — LOPERAMIDE HCL 2 MG PO CAPS: 2.0000 mg | ORAL_CAPSULE | ORAL | Status: DC | PRN

## 2021-09-24 MED ORDER — NAPROXEN 500 MG PO TABS: 500.0000 mg | ORAL_TABLET | Freq: Two times a day (BID) | ORAL | Status: DC | PRN

## 2021-09-24 MED ORDER — METHOCARBAMOL 500 MG PO TABS: 500.0000 mg | ORAL_TABLET | Freq: Three times a day (TID) | ORAL | Status: DC | PRN

## 2021-09-24 MED ORDER — LORAZEPAM 1 MG PO TABS
1.0000 mg | ORAL_TABLET | Freq: Once | ORAL | Status: AC
  Administered 2021-09-24: 1 mg via ORAL
  Filled 2021-09-24: qty 1

## 2021-09-24 MED ORDER — ONDANSETRON 4 MG PO TBDP: 4.0000 mg | ORAL_TABLET | Freq: Four times a day (QID) | ORAL | Status: DC | PRN

## 2021-09-24 MED ORDER — CLONIDINE HCL 0.1 MG PO TABS
0.1000 mg | ORAL_TABLET | Freq: Once | ORAL | Status: AC
  Administered 2021-09-24: 0.1 mg via ORAL
  Filled 2021-09-24: qty 1

## 2021-09-24 MED ORDER — HYDROXYZINE HCL 25 MG PO TABS: 25.0000 mg | ORAL_TABLET | Freq: Four times a day (QID) | ORAL | Status: DC | PRN

## 2021-09-24 NOTE — BH Assessment (Signed)
Detox  Referral information for detox treatment faxed to:   [facility] ([phone] or [phone])   [facility] ([phone])   [facility] ([phone])   [facility] ([phone])  . [facility] ([phone])   [facility] ([phone])  . [facility] ([phone])   

## 2021-09-24 NOTE — ED Notes (Signed)
Pt given PO Ativan once awake for dinner.

## 2021-09-24 NOTE — BH Assessment (Signed)
Comprehensive Clinical Assessment (CCA) Note  09/24/2021 Jorge Maldonado [id number]  Angelica Chessman, 66 year old male who presents to [facility] ED involuntarily for treatment. Per triage note, Per EMS, pt was endorsing SI due to heroin withdrawal today. Pt states he last used heroin yesterday at 10 am and c/o malaise today- headache, body ache, nausea, weakness. Vitals WNL for EMS. Pt is AOX4, appears uncomfortable and slightly diaphoretic.   During TTS assessment pt presents alert and oriented x 4, restless but cooperative, and mood-congruent with affect. The pt does not appear to be responding to internal or external stimuli. Neither is the pt presenting with any delusional thinking. Pt verified the information provided to triage RN.   Pt identifies his main complaint to be that he is going through withdrawals and wants detox from heroin. Patient described symptoms of restlessness and body aches. Patient reports he last used 2 days ago and has been using daily for 8 months. Patient reports having prior inpatient history but was unable to recall where or when. Pt denies current SI/HI/AH/VH. Patient appears physically uncomfortable.   Patient will be referred to inpatient facilities for rehab/detox.   Chief Complaint:  Chief Complaint  Patient presents with   Suicidal   Visit Diagnosis: Cocaine use    CCA Screening, Triage and Referral (STR)  Patient Reported Information How did you hear about [place]? Self  Referral name: No data recorded Referral phone number: No data recorded  Whom do you see for routine medical problems? No data recorded Practice/Facility Name: No data recorded Practice/Facility Phone Number: No data recorded Name of Contact: No data recorded Contact Number: No data recorded Contact Fax Number: No data recorded Prescriber Name: No data recorded Prescriber Address (if known): No data recorded  What Is the Reason for Your Visit/Call Today? Patient was brought to the ED due  to withdrawal symptoms from heroin abuse.  How Long Has This Been Causing You Problems? <Week  What Do You Feel Would Help You the Most Today? Alcohol or Drug Use Treatment   Have You Recently Been in Any Inpatient Treatment (Hospital/Detox/Crisis Center/28-Day Program)? No data recorded Name/Location of Program/Hospital:No data recorded How Long Were You There? No data recorded When Were You Discharged? No data recorded  Have You Ever Received Services From [facility] Before? No data recorded Who Do You See at [facility]? No data recorded  Have You Recently Had Any Thoughts About Hurting Yourself? No  Are You Planning to Commit Suicide/Harm Yourself At This time? No   Have you Recently Had Thoughts About Hurting Someone Karolee Ohs? No  Explanation: No data recorded  Have You Used Any Alcohol or Drugs in the Past 24 Hours? Yes  How Long Ago Did You Use Drugs or Alcohol? No data recorded What Did You Use and How Much? Heroin- unknown   Do You Currently Have a Therapist/Psychiatrist? No  Name of Therapist/Psychiatrist: No data recorded  Have You Been Recently Discharged From Any Office Practice or Programs? No  Explanation of Discharge From Practice/Program: No data recorded    CCA Screening Triage Referral Assessment Type of Contact: Face-to-Face  Is this Initial or Reassessment? No data recorded Date Telepsych consult ordered in [facility]:  No data recorded Time Telepsych consult ordered in [facility]:  No data recorded  Patient Reported Information Reviewed? No data recorded Patient Left Without Being Seen? No data recorded Reason for Not Completing Assessment: No data recorded  Collateral Involvement: None provided   Does Patient Have a Court  Appointed Legal Guardian? No data recorded Name and Contact of Legal Guardian: No data recorded If Minor and Not Living with Parent(s), Who has Custody? n/a  Is CPS involved or ever been involved? Never  Is APS involved or ever been  involved? Never   Patient Determined To Be At Risk for Harm To Self or Others Based on Review of Patient Reported Information or Presenting Complaint? No  Method: No data recorded Availability of Means: No data recorded Intent: No data recorded Notification Required: No data recorded Additional Information for Danger to Others Potential: No data recorded Additional Comments for Danger to Others Potential: No data recorded Are There Guns or Other Weapons in Your Home? No data recorded Types of Guns/Weapons: No data recorded Are These Weapons Safely Secured?                            No data recorded Who Could Verify You Are Able To Have These Secured: No data recorded Do You Have any Outstanding Charges, Pending Court Dates, Parole/Probation? No data recorded Contacted To Inform of Risk of Harm To Self or Others: No data recorded  Location of Assessment: [facility] ED   Does Patient Present under Involuntary Commitment? No  IVC Papers Initial File Date: No data recorded  [place] of Residence:    Patient Currently Receiving the Following Services: Not Receiving Services   Determination of Need: Emergent (2 hours)   Options For Referral: ED Visit; Chemical Dependency Intensive Outpatient Therapy (CDIOP)     CCA Biopsychosocial Intake/Chief Complaint:  No data recorded Current Symptoms/Problems: No data recorded  Patient Reported Schizophrenia/Schizoaffective Diagnosis in Past: No   Strengths: Patient able to communicate and verbalize needs.  Preferences: No data recorded Abilities: No data recorded  Type of Services Patient Feels are Needed: No data recorded  Initial Clinical Notes/Concerns: No data recorded  Mental Health Symptoms Depression:   None   Duration of Depressive symptoms: No data recorded  Mania:   None   Anxiety:    Restlessness   Psychosis:   None   Duration of Psychotic symptoms: No data recorded  Trauma:   None   Obsessions:    None   Compulsions:   None   Inattention:   None   Hyperactivity/Impulsivity:   None   Oppositional/Defiant Behaviors:   None   Emotional Irregularity:   None   Other Mood/Personality Symptoms:  No data recorded   Mental Status Exam Appearance and self-care  Stature:   Average   Weight:   Average weight   Clothing:   Disheveled   Grooming:   Neglected   Cosmetic use:   None   Posture/gait:   Slumped   Motor activity:   Restless   Sensorium  Attention:   Distractible   Concentration:   Preoccupied   Orientation:   X5   Recall/memory:   Normal   Affect and Mood  Affect:   Anxious   Mood:   Anxious   Relating  Eye contact:   None   Facial expression:   Constricted   Attitude toward examiner:   Cooperative   Thought and Language  Speech flow:  Clear and Coherent   Thought content:   Appropriate to Mood and Circumstances   Preoccupation:   None   Hallucinations:   None   Organization:  No data recorded  [employer] of Knowledge:   Average   Intelligence:   Average  Abstraction:   Normal   Judgement:   Impaired   Reality Testing:   Adequate   Insight:   Fair   Decision Making:   Normal   Social Functioning  Social Maturity:   Irresponsible   Social Judgement:   "Street Smart"   Stress  Stressors:   Transitions; Illness   Coping Ability:   Exhausted   Skill Deficits:   Decision making; Self-control   Supports:   Family     Religion:    Leisure/Recreation:    Exercise/Diet:     CCA Employment/Education Employment/Work Situation: Employment / Work Technical sales engineer: Unemployed  Education: Education Is Patient Currently Attending School?: No   CCA Family/Childhood History Family and Relationship History:    Childhood History:     Child/Adolescent Assessment:     CCA Substance Use Alcohol/Drug Use: Alcohol / Drug Use Pain Medications:  See PTA Prescriptions: See PTA Over the Counter: See PTA History of alcohol / drug use?: Yes Longest period of sobriety (when/how long): Unknown Withdrawal Symptoms: Tremors Substance #1 Name of Substance 1: Heroin 1 - Frequency: daily 1 - Duration: 8 months 1 - Last Use / Amount: 09/22/21                       ASAM's:  Six Dimensions of Multidimensional Assessment  Dimension 1:  Acute Intoxication and/or Withdrawal Potential:      Dimension 2:  Biomedical Conditions and Complications:      Dimension 3:  Emotional, Behavioral, or Cognitive Conditions and Complications:     Dimension 4:  Readiness to Change:     Dimension 5:  Relapse, Continued use, or Continued Problem Potential:     Dimension 6:  Recovery/Living Environment:     ASAM Severity Score:    ASAM Recommended Level of Treatment:     Substance use Disorder (SUD) Substance Use Disorder (SUD)  Checklist Symptoms of Substance Use: Continued use despite having a persistent/recurrent physical/psychological problem caused/exacerbated by use, Continued use despite persistent or recurrent social, interpersonal problems, caused or exacerbated by use, Evidence of tolerance, Evidence of withdrawal (Comment), Persistent desire or unsuccessful efforts to cut down or control use, Recurrent use that results in a failure to fulfill major role obligations (work, school, home), Substance(s) often taken in larger amounts or over longer times than was intended  Recommendations for Services/Supports/Treatments: Recommendations for Services/Supports/Treatments Recommendations For Services/Supports/Treatments: Detox  DSM5 Diagnoses: Patient Active Problem List   Diagnosis Date Noted   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

## 2021-09-24 NOTE — ED Notes (Signed)
TTS was supported by the pts nurse in attempting to complete psychiatric evaluation. Clt became uncooperative, requesting to be assessed at a later time.

## 2021-09-24 NOTE — ED Provider Notes (Signed)
Emergency Medicine Observation Re-evaluation Note  Jorge Maldonado is a 66 y.o. male, seen on rounds today.  Pt initially presented to the ED for complaints of Suicidal Currently, the patient is resting.  Physical Exam  BP (!) 162/92 (BP Location: Right Arm)   Pulse 67   Temp 98.4 F (36.9 C) (Oral)   Resp (!) 22   SpO2 97%  Physical Exam Gen:  No acute distress Resp:  Breathing easily and comfortably, no accessory muscle usage Neuro:  Moving all four extremities, no gross focal neuro deficits Psych:  Resting currently, calm when awake  ED Course / MDM  EKG:   I have reviewed the labs performed to date as well as medications administered while in observation.  Recent changes in the last 24 hours include initial EDP evaluation.  Plan  Current plan is for psychiatric assessment.  Jorge Maldonado is not under involuntary commitment.     [name], MD 09/24/21 [phone]

## 2021-09-24 NOTE — ED Notes (Signed)
Patient given breakfast tray.

## 2021-09-24 NOTE — Consult Note (Signed)
[facility] Face-to-Face Psychiatry Consult   Reason for Consult:  withdrawal Referring Physician: EDP Patient Identification: Jorge Maldonado MRN:  [id number] Principal Diagnosis: Cocaine abuse (HCC) Diagnosis:  Principal Problem:   Cocaine abuse (HCC)   Total Time spent with patient: 30 minutes  Subjective:  "I need to get off heroin." Jorge Maldonado is a 66 y.o. male patient admitted with reported heroin use.  HPI:  Patient presented to ED with reported paranoid withdrawal.  Patient had UDS positive for cocaine and opiates.  He denies suicidal ideation.  He states that he wants to go into detox to get off of heroin.  He reports that he is tired of living like this, but does not have any intent or plan for suicide.  Denies homicidal ideation, paranoia, auditory or visual hallucinations.  Patient speaks in linear sentences.  Does not appear to be responding to internal stimuli.  He states he is physically uncomfortable from his symptoms.       Refer to inpatient rehabilitation.  Patient will need urine drug screen prior to this.                               Past Psychiatric History: Substance abuse  Risk to Self:   Risk to Others:   Prior Inpatient Therapy:   Prior Outpatient Therapy:    Past Medical History:  Past Medical History:  Diagnosis Date   Hypertension     Past Surgical History:  Procedure Laterality Date   FRACTURE SURGERY     Family History: History reviewed. No pertinent family history. Family Psychiatric  History: Unknown Social History:  Social History   Substance and Sexual Activity  Alcohol Use Yes     Social History   Substance and Sexual Activity  Drug Use Not Currently    Social History   Socioeconomic History   Marital status: Divorced    Spouse name: Not on file   Number of children: Not on file   Years of education: Not on file   Highest education level: Not on file  Occupational History   Not on file  Tobacco Use   Smoking status: Every  Day   Smokeless tobacco: Never  Vaping Use   Vaping Use: Never used  Substance and Sexual Activity   Alcohol use: Yes   Drug use: Not Currently   Sexual activity: Not on file  Other Topics Concern   Not on file  Social History Narrative   Not on file   Social Determinants of Health   Financial Resource Strain: Not on file  Food Insecurity: Not on file  Transportation Needs: Not on file  Physical Activity: Not on file  Stress: Not on file  Social Connections: Not on file   Additional Social History:    Allergies:  No Known Allergies  Labs:  Results for orders placed or performed during the hospital encounter of 09/23/21 (from the past 48 hour(s))  Urine Drug Screen, Qualitative     Status: Abnormal   Collection Time: 09/23/21  9:28 AM  Result Value Ref Range   Tricyclic, Ur Screen NONE DETECTED NONE DETECTED   Amphetamines, Ur Screen NONE DETECTED NONE DETECTED   MDMA (Ecstasy)Ur Screen NONE DETECTED NONE DETECTED   Cocaine Metabolite,Ur [place] POSITIVE (A) NONE DETECTED   Opiate, Ur Screen POSITIVE (A) NONE DETECTED   Phencyclidine (PCP) Ur S NONE DETECTED NONE DETECTED   Cannabinoid 50 Ng, Ur [place] NONE  DETECTED NONE DETECTED   Barbiturates, Ur Screen NONE DETECTED NONE DETECTED   Benzodiazepine, Ur Scrn NONE DETECTED NONE DETECTED   Methadone Scn, Ur NONE DETECTED NONE DETECTED    Comment: (NOTE) Tricyclics + metabolites, urine    Cutoff 1000 ng/mL Amphetamines + metabolites, urine  Cutoff 1000 ng/mL MDMA (Ecstasy), urine              Cutoff 500 ng/mL Cocaine Metabolite, urine          Cutoff 300 ng/mL Opiate + metabolites, urine        Cutoff 300 ng/mL Phencyclidine (PCP), urine         Cutoff 25 ng/mL Cannabinoid, urine                 Cutoff 50 ng/mL Barbiturates + metabolites, urine  Cutoff 200 ng/mL Benzodiazepine, urine              Cutoff 200 ng/mL Methadone, urine                   Cutoff 300 ng/mL  The urine drug screen provides only a preliminary,  unconfirmed analytical test result and should not be used for non-medical purposes. Clinical consideration and professional judgment should be applied to any positive drug screen result due to possible interfering substances. A more specific alternate chemical method must be used in order to obtain a confirmed analytical result. Gas chromatography / mass spectrometry (GC/MS) is the preferred confirm atory method. Performed at [facility], [address] [place], [place] [address]   Resp Panel by RT-PCR (Flu A&B, Covid) Anterior Nasal Swab     Status: None   Collection Time: 09/23/21  9:37 AM   Specimen: Anterior Nasal Swab  Result Value Ref Range   SARS Coronavirus 2 by RT PCR NEGATIVE NEGATIVE    Comment: (NOTE) SARS-CoV-2 target nucleic acids are NOT DETECTED.  The SARS-CoV-2 RNA is generally detectable in upper respiratory specimens during the acute phase of infection. The lowest concentration of SARS-CoV-2 viral copies this assay can detect is 138 copies/mL. A negative result does not preclude SARS-Cov-2 infection and should not be used as the sole basis for treatment or other patient management decisions. A negative result may occur with  improper specimen collection/handling, submission of specimen other than nasopharyngeal swab, presence of viral mutation(s) within the areas targeted by this assay, and inadequate number of viral copies(<138 copies/mL). A negative result must be combined with clinical observations, patient history, and epidemiological information. The expected result is Negative.  Fact Sheet for Patients:  BloggerCourse.com  Fact Sheet for Healthcare Providers:  SeriousBroker.it  This test is no t yet approved or cleared by the [place] FDA and  has been authorized for detection and/or diagnosis of SARS-CoV-2 by FDA under an Emergency Use Authorization (EUA). This EUA will remain  in  effect (meaning this test can be used) for the duration of the COVID-19 declaration under Section 564(b)(1) of the Act, 21 U.S.C.section 360bbb-3(b)(1), unless the authorization is terminated  or revoked sooner.       Influenza A by PCR NEGATIVE NEGATIVE   Influenza B by PCR NEGATIVE NEGATIVE    Comment: (NOTE) The Xpert Xpress SARS-CoV-2/FLU/RSV plus assay is intended as an aid in the diagnosis of influenza from Nasopharyngeal swab specimens and should not be used as a sole basis for treatment. Nasal washings and aspirates are unacceptable for Xpert Xpress SARS-CoV-2/FLU/RSV testing.  Fact Sheet for Patients: BloggerCourse.com  Fact Sheet for  Healthcare Providers: SeriousBroker.it  This test is not yet approved or cleared by the [place] and has been authorized for detection and/or diagnosis of SARS-CoV-2 by FDA under an Emergency Use Authorization (EUA). This EUA will remain in effect (meaning this test can be used) for the duration of the COVID-19 declaration under Section 564(b)(1) of the Act, 21 U.S.C. section 360bbb-3(b)(1), unless the authorization is terminated or revoked.  Performed at [facility], [address] [place], [place] [address]   Comprehensive metabolic panel     Status: Abnormal   Collection Time: 09/23/21  6:13 PM  Result Value Ref Range   Sodium 140 135 - 145 mmol/L   Potassium 3.5 3.5 - 5.1 mmol/L   Chloride 102 98 - 111 mmol/L   CO2 28 22 - 32 mmol/L   Glucose, Bld 141 (H) 70 - 99 mg/dL    Comment: Glucose reference range applies only to samples taken after fasting for at least 8 hours.   BUN 13 8 - 23 mg/dL   Creatinine, Ser 1.91 0.61 - 1.24 mg/dL   Calcium 9.1 8.9 - 47.8 mg/dL   Total Protein 7.1 6.5 - 8.1 g/dL   Albumin 3.9 3.5 - 5.0 g/dL   AST 22 15 - 41 U/L   ALT 15 0 - 44 U/L   Alkaline Phosphatase 79 38 - 126 U/L   Total Bilirubin 1.5 (H) 0.3 - 1.2 mg/dL   GFR,  Estimated >29 >56 mL/min    Comment: (NOTE) Calculated using the CKD-EPI Creatinine Equation (2021)    Anion gap 10 5 - 15    Comment: Performed at [facility], [address] [place], [place] [address]  Ethanol     Status: None   Collection Time: 09/23/21  6:13 PM  Result Value Ref Range   Alcohol, Ethyl (B) <10 <10 mg/dL    Comment: (NOTE) Lowest detectable limit for serum alcohol is 10 mg/dL.  For medical purposes only. Performed at [facility], [address] [place], [place] [address]   Salicylate level     Status: Abnormal   Collection Time: 09/23/21  6:13 PM  Result Value Ref Range   Salicylate Lvl <7.0 (L) 7.0 - 30.0 mg/dL    Comment: Performed at [facility], [address] [place], [place] [address]  Acetaminophen level     Status: Abnormal   Collection Time: 09/23/21  6:13 PM  Result Value Ref Range   Acetaminophen (Tylenol), Serum <10 (L) 10 - 30 ug/mL    Comment: (NOTE) Therapeutic concentrations vary significantly. A range of 10-30 ug/mL  may be an effective concentration for many patients. However, some  are best treated at concentrations outside of this range. Acetaminophen concentrations >150 ug/mL at 4 hours after ingestion  and >50 ug/mL at 12 hours after ingestion are often associated with  toxic reactions.  Performed at [facility], [address] [place], [place] [address]   cbc     Status: None   Collection Time: 09/23/21  6:13 PM  Result Value Ref Range   WBC 8.5 4.0 - 10.5 K/uL   RBC 5.06 4.22 - 5.81 MIL/uL   Hemoglobin 14.9 13.0 - 17.0 g/dL   HCT 32.4 40.1 - 02.7 %   MCV 89.9 80.0 - 100.0 fL   MCH 29.4 26.0 - 34.0 pg   MCHC 32.7 30.0 - 36.0 g/dL   RDW 25.3 66.4 - 40.3 %   Platelets 315 150 - 400 K/uL  nRBC 0.0 0.0 - 0.2 %    Comment: Performed at [facility], [address] [place], [place] [address]    No current facility-administered medications for this encounter.   No  current outpatient medications on file.    Musculoskeletal: Strength & Muscle Tone: within normal limits Gait & Station: normal Patient leans: N/A   Psychiatric Specialty Exam:  Presentation  General Appearance: Disheveled  Eye Contact:Fair  Speech:Clear and Coherent  Speech Volume:Normal  Handedness:No data recorded  Mood and Affect  Mood:Anxious; Irritable  Affect:Blunt   Thought Process  Thought Processes:Coherent  Descriptions of Associations:Intact  Orientation:Full (Time, Place and Person)  Thought Content:WDL  History of Schizophrenia/Schizoaffective disorder:No data recorded Duration of Psychotic Symptoms:No data recorded Hallucinations:Hallucinations: None  Ideas of Reference:None  Suicidal Thoughts:Suicidal Thoughts: No  Homicidal Thoughts:Homicidal Thoughts: No   Sensorium  Memory:Immediate Good  Judgment:Fair  Insight:Fair   Executive Functions  Concentration:Fair  Attention Span:Fair  Recall:Fair  Fund of Knowledge:Fair  Language:Fair   Psychomotor Activity  Psychomotor Activity:Psychomotor Activity: Normal   Assets  Assets:Housing   Sleep  Sleep:Sleep: Fair   Physical Exam: Physical Exam Vitals and nursing note reviewed.  HENT:     Head: Normocephalic.     Nose: No congestion or rhinorrhea.  Eyes:     General:        Right eye: No discharge.        Left eye: No discharge.  Cardiovascular:     Rate and Rhythm: Normal rate.  Pulmonary:     Effort: Pulmonary effort is normal.  Musculoskeletal:        General: Normal range of motion.     Cervical back: Normal range of motion.  Skin:    General: Skin is dry.  Neurological:     Mental Status: He is alert and oriented to person, place, and time.  Psychiatric:        Attention and Perception: He is inattentive.        Mood and Affect: Affect is blunt (irritable).        Speech: Speech normal.        Behavior: Behavior is agitated.        Thought Content:  Thought content is not paranoid or delusional. Thought content does not include homicidal or suicidal ideation.        Cognition and Memory: Cognition normal.        Judgment: Judgment is impulsive.    Review of Systems  Constitutional: Negative.   Eyes: Negative.   Respiratory: Negative.    Genitourinary: Negative.   Neurological: Negative.   Psychiatric/Behavioral:  Positive for depression (stable) and substance abuse. Negative for hallucinations, memory loss and suicidal ideas. The patient is nervous/anxious. The patient does not have insomnia.    Blood pressure (!) 184/105, pulse 76, temperature 98.6 F (37 C), temperature source Oral, resp. rate 18, SpO2 98 %. There is no height or weight on file to calculate BMI.  Treatment Plan Summary: Plan Patient presented to ED voluntarily asking for detox from heroin .He denies suicidal ideation and states that he is interested in inpatient rehabilitation. Patient referred to TTS counselor to refer out for substance use rehab. Reviewed with EDP    Disposition: No evidence of imminent risk to self or others at present.   Supportive therapy provided about ongoing stressors. Discussed crisis plan, support from social network, calling 911, coming to the Emergency Department, and calling Suicide Hotline.Refer to substance treatment   Vanetta Mulders, NP  09/24/2021 12:34 PM

## 2021-09-25 NOTE — ED Notes (Signed)
Pt discharged to sister to transport to inpatient drug tx.  Pt was a/o x3 denies current SI/HI and stated no current a/v hallucinations.  all belongings accounted for and returned to patient.

## 2021-09-25 NOTE — ED Notes (Signed)
Report received from Ally Y , RN including SBAR. On initial round after report Pt is warm/dry, resting quietly in room without any s/s of distress.  Will continue to monitor throughout shift as ordered for any changes in behaviors and for continued safety.   ?

## 2021-09-25 NOTE — ED Notes (Signed)
TTS spoke with Jorge Maldonado and Jorge Maldonado at [facility] regarding Jorge Maldonado. TTS was informed that a federal photo id was required as well as personal transportation, as no transportation from the facility would be available due to the holiday.   TTS spoke with Jorge Maldonado sister Jorge Maldonado - [phone]).  She reported that she has Jorge Maldonado Identification and that she reports that she will be pick up Jorge Maldonado by 3:45 p.m. to take him to [facility] in [place] for screening for admission.    Jorge Maldonado) was informed that the requested documentation was available and transportation will be provided by his Jorge Maldonado.

## 2021-09-25 NOTE — ED Notes (Signed)
Pt moving around in bed. NT asked pt if he was in need of anything; pt didn't respond.

## 2021-09-25 NOTE — ED Provider Notes (Signed)
Emergency Medicine Observation Re-evaluation Note  Jorge Maldonado is a 66 y.o. male, seen on rounds today.  Pt initially presented to the ED for complaints of Suicidal Currently, the patient is resting.  Physical Exam  BP (!) 172/95   Pulse 76   Temp 98.6 F (37 C) (Oral)   Resp 18   SpO2 98%  Physical Exam Gen:  No acute distress Resp:  Breathing easily and comfortably, no accessory muscle usage Neuro:  Moving all four extremities, no gross focal neuro deficits Psych:  Resting currently, calm when awake  ED Course / MDM  EKG:   I have reviewed the labs performed to date as well as medications administered while in observation.  Recent changes in the last 24 hours include no significant changes..  Plan  Current plan is for psychiatric placement.  Jorge Maldonado is not under involuntary commitment.     Jorge Rose, MD 09/25/21 [phone]

## 2021-09-25 NOTE — ED Notes (Signed)
Hospital meal provided, pt tolerated w/o complaints.  Waste discarded appropriately.  

## 2021-10-31 ENCOUNTER — Other Ambulatory Visit: Payer: Self-pay

## 2021-10-31 ENCOUNTER — Encounter: Payer: Self-pay | Admitting: Emergency Medicine

## 2021-10-31 ENCOUNTER — Emergency Department
Admission: EM | Admit: 2021-10-31 | Discharge: 2021-11-01 | Disposition: A | Discharge status: Home / Self Care | Payer: Medicaid Other | Attending: Emergency Medicine | Admitting: Emergency Medicine

## 2021-10-31 DIAGNOSIS — F15950 Other stimulant use, unspecified with stimulant-induced psychotic disorder with delusions: Secondary | ICD-10-CM | POA: Diagnosis present

## 2021-10-31 DIAGNOSIS — I10 Essential (primary) hypertension: Secondary | ICD-10-CM | POA: Insufficient documentation

## 2021-10-31 DIAGNOSIS — F1410 Cocaine abuse, uncomplicated: Secondary | ICD-10-CM | POA: Diagnosis present

## 2021-10-31 DIAGNOSIS — F1210 Cannabis abuse, uncomplicated: Secondary | ICD-10-CM | POA: Diagnosis not present

## 2021-10-31 DIAGNOSIS — M79605 Pain in left leg: Secondary | ICD-10-CM | POA: Diagnosis not present

## 2021-10-31 DIAGNOSIS — M79604 Pain in right leg: Secondary | ICD-10-CM | POA: Insufficient documentation

## 2021-10-31 DIAGNOSIS — F1010 Alcohol abuse, uncomplicated: Secondary | ICD-10-CM | POA: Diagnosis present

## 2021-10-31 DIAGNOSIS — F1510 Other stimulant abuse, uncomplicated: Secondary | ICD-10-CM | POA: Diagnosis present

## 2021-10-31 DIAGNOSIS — M7918 Myalgia, other site: Secondary | ICD-10-CM

## 2021-10-31 LAB — URINALYSIS, ROUTINE W REFLEX MICROSCOPIC
Bilirubin Urine: NEGATIVE
Glucose, UA: NEGATIVE mg/dL
Hgb urine dipstick: NEGATIVE
Ketones, ur: NEGATIVE mg/dL
Leukocytes,Ua: NEGATIVE
Nitrite: NEGATIVE
Protein, ur: NEGATIVE mg/dL
Specific Gravity, Urine: 1.015 (ref 1.005–1.030)
pH: 5 (ref 5.0–8.0)

## 2021-10-31 LAB — URINE DRUG SCREEN, QUALITATIVE (ARMC ONLY)
Amphetamines, Ur Screen: NOT DETECTED
Barbiturates, Ur Screen: NOT DETECTED
Benzodiazepine, Ur Scrn: NOT DETECTED
Cannabinoid 50 Ng, Ur ~~LOC~~: POSITIVE — AB
Cocaine Metabolite,Ur ~~LOC~~: POSITIVE — AB
MDMA (Ecstasy)Ur Screen: NOT DETECTED
Methadone Scn, Ur: NOT DETECTED
Opiate, Ur Screen: NOT DETECTED
Phencyclidine (PCP) Ur S: NOT DETECTED
Tricyclic, Ur Screen: NOT DETECTED

## 2021-10-31 LAB — ETHANOL: Alcohol, Ethyl (B): 33 mg/dL — ABNORMAL HIGH (ref <10)

## 2021-10-31 LAB — CBC WITH DIFFERENTIAL/PLATELET
Abs Immature Granulocytes: 0.02 10*3/uL (ref 0.00–0.07)
Basophils Absolute: 0 10*3/uL (ref 0.0–0.1)
Basophils Relative: 1 %
Eosinophils Absolute: 0.1 10*3/uL (ref 0.0–0.5)
Eosinophils Relative: 2 %
HCT: 41.4 % (ref 39.0–52.0)
Hemoglobin: 13.8 g/dL (ref 13.0–17.0)
Immature Granulocytes: 0 %
Lymphocytes Relative: 25 %
Lymphs Abs: 1.3 10*3/uL (ref 0.7–4.0)
MCH: 29.8 pg (ref 26.0–34.0)
MCHC: 33.3 g/dL (ref 30.0–36.0)
MCV: 89.4 fL (ref 80.0–100.0)
Monocytes Absolute: 0.8 10*3/uL (ref 0.1–1.0)
Monocytes Relative: 14 %
Neutro Abs: 3 10*3/uL (ref 1.7–7.7)
Neutrophils Relative %: 58 %
Platelets: 236 10*3/uL (ref 150–400)
RBC: 4.63 MIL/uL (ref 4.22–5.81)
RDW: 13.6 % (ref 11.5–15.5)
WBC: 5.2 10*3/uL (ref 4.0–10.5)
nRBC: 0 % (ref 0.0–0.2)

## 2021-10-31 LAB — COMPREHENSIVE METABOLIC PANEL
ALT: 10 U/L (ref 0–44)
AST: 17 U/L (ref 15–41)
Albumin: 3.6 g/dL (ref 3.5–5.0)
Alkaline Phosphatase: 58 U/L (ref 38–126)
Anion gap: 13 (ref 5–15)
BUN: 15 mg/dL (ref 8–23)
CO2: 22 mmol/L (ref 22–32)
Calcium: 8.6 mg/dL — ABNORMAL LOW (ref 8.9–10.3)
Chloride: 106 mmol/L (ref 98–111)
Creatinine, Ser: 1.06 mg/dL (ref 0.61–1.24)
GFR, Estimated: >60 mL/min (ref >60)
Glucose, Bld: 156 mg/dL — ABNORMAL HIGH (ref 70–99)
Potassium: 3.3 mmol/L — ABNORMAL LOW (ref 3.5–5.1)
Sodium: 141 mmol/L (ref 135–145)
Total Bilirubin: 0.7 mg/dL (ref 0.3–1.2)
Total Protein: 6.4 g/dL — ABNORMAL LOW (ref 6.5–8.1)

## 2021-10-31 MED ORDER — NAPROXEN 500 MG PO TABS
500.0000 mg | ORAL_TABLET | Freq: Once | ORAL | Status: AC
  Administered 2021-10-31: 500 mg via ORAL
  Filled 2021-10-31: qty 1

## 2021-10-31 NOTE — ED Provider Notes (Signed)
[facility] Provider Note    Event Date/Time   First MD Initiated Contact with Patient 10/31/21 1819     (approximate)   History   Leg Pain   HPI  Jorge Maldonado is a 66 y.o. male with history of hypertension, polysubstance abuse, and alcohol abuse presents to the emergency department for treatment and evaluation of bilateral leg pain.  Patient states that his legs just "gave out" today.  He denies associated injury.  States this is never happened to him before.  No alleviating measures attempted prior to arrival.  Past Medical History:  Diagnosis Date  . Hypertension      Physical Exam   Triage Vital Signs: ED Triage Vitals  Enc Vitals Group     BP 10/31/21 1702 124/83     Pulse Rate 10/31/21 1702 96     Resp 10/31/21 1702 16     Temp 10/31/21 1702 98.8 F (37.1 C)     Temp Source 10/31/21 1702 Oral     SpO2 10/31/21 1702 96 %     Weight 10/31/21 1703 165 lb (74.8 kg)     Height 10/31/21 1703 6' (1.829 m)     Head Circumference --      Peak Flow --      Pain Score 10/31/21 1703 10     Pain Loc --      Pain Edu? --      Excl. in GC? --     Most recent vital signs: Vitals:   10/31/21 1827 10/31/21 1900  BP: (!) 154/89 (!) 158/89  Pulse: 88 82  Resp: (!) 22 (!) 25  Temp:    SpO2: 95% 95%    General: Awake, no distress.  CV:  Good peripheral perfusion.  Resp:  Normal effort.  Abd:  No distention.  Other:  Somnolent but easily awakens to verbal command.  Once awake, answers questions appropriately.  Neuroexam without acute concerns.   ED Results / Procedures / Treatments   Labs (all labs ordered are listed, but only abnormal results are displayed) Labs Reviewed  COMPREHENSIVE METABOLIC PANEL - Abnormal; Notable for the following components:      Result Value   Potassium 3.3 (*)    Glucose, Bld 156 (*)    Calcium 8.6 (*)    Total Protein 6.4 (*)    All other components within normal limits  URINALYSIS, ROUTINE W REFLEX  MICROSCOPIC - Abnormal; Notable for the following components:   Color, Urine YELLOW (*)    APPearance CLEAR (*)    All other components within normal limits  URINE DRUG SCREEN, QUALITATIVE ([facility] ONLY) - Abnormal; Notable for the following components:   Cocaine Metabolite,Ur [place] POSITIVE (*)    Cannabinoid 50 Ng, Ur [place] POSITIVE (*)    All other components within normal limits  ETHANOL - Abnormal; Notable for the following components:   Alcohol, Ethyl (B) 33 (*)    All other components within normal limits  CBC WITH DIFFERENTIAL/PLATELET     EKG     RADIOLOGY  Not indicated.  I have independently reviewed and interpreted imaging as well as reviewed report from radiology.  PROCEDURES:  Critical Care performed: No  Procedures   MEDICATIONS ORDERED IN ED:  Medications  naproxen (NAPROSYN) tablet 500 mg (500 mg Oral Given 10/31/21 2217)     IMPRESSION / MDM / ASSESSMENT AND PLAN / ED COURSE   I reviewed the triage vital signs and the nursing notes.  Differential diagnosis includes, but is not limited to: generalized weakness, substance abuse, electrolyte abnormality  Patient's presentation is most consistent with acute illness / injury with system symptoms.  66 year old male presenting to the emergency department for treatment and evaluation of generalized weakness.  He reports symptoms started today.  Patient was observed ambulating into the department and getting on the bed without assistance.  Equal strength in upper and lower extremities.  Patient falls asleep easily and states that "he does not think" he has taken any medications or illicit drugs today.  When asleep, respirations are even and unlabored.  Will get screening labs and urine including urine drug screen.  Patient awake.  He was given a sandwich tray and drink.  Labs show an ethanol level of 33 but are otherwise reassuring.  Urine test positive for cocaine and cannabinoids.  Plan will be to discharge him home  now that he is more awake and has been able to eat and drink.  He tells me that his legs are still hurting.  Naprosyn ordered. Clinical Course as of 10/31/21 2228  Wed Oct 31, 2021  2224 Nurse attempted to discharge the patient to became angry.  He does not want to be discharged.  He stated that he could not walk but then sat up in the bed and turned his legs to the side and stated "I will just kill my .  I will just go jump out in front of a moving car."  Patient questioned as to why he was so angry and he states that his leg is hurting and we did not do anything for it.  Advised him that he had received Naprosyn.  He wants something different.  After this discussion, the patient walked out of his room and to the bathroom while being escorted by staff.  Plan will be to consult psychiatry for the threat of suicide although it seems that the patient just wants to be able to stay here and sleep. [CT]    Clinical Course User Index [CT] Oval Cavazos B, FNP     FINAL CLINICAL IMPRESSION(S) / ED DIAGNOSES   Final diagnoses:  Musculoskeletal pain  Cocaine abuse (HCC)  Marijuana abuse     Rx / DC Orders   ED Discharge Orders     None        Note:  This document was prepared using Dragon voice recognition software and may include unintentional dictation errors.

## 2021-10-31 NOTE — Discharge Instructions (Addendum)
Steps to find a Primary Care Provider (PCP):  Call [phone] or [phone] to access "[place] Find a Doctor Service."  2.  You may also go on the [place] website at [email or web address]  

## 2021-10-31 NOTE — ED Provider Triage Note (Signed)
  Emergency Medicine Provider Triage Evaluation Note  Jorge Maldonado , a 66 y.o.male,  was evaluated in triage.  Pt complains of pain in the right lower extremity.  He states that this has been going on for the past few months.  Denies any recent falls or injuries.  When asked specifically about where it hurts the most, he states that he just hurts all over.   Review of Systems  Positive: Right lower extremity pain Negative: Denies fever, chest pain, vomiting  Physical Exam   Vitals:   10/31/21 1702  BP: 124/83  Pulse: 96  Resp: 16  Temp: 98.8 F (37.1 C)  SpO2: 96%   Gen:   Awake, no distress   Resp:  Normal effort  MSK:   Moves extremities without difficulty  Other:    Medical Decision Making  Given the patient's initial medical screening exam, the following diagnostic evaluation has been ordered. The patient will be placed in the appropriate treatment space, once one is available, to complete the evaluation and treatment. I have discussed the plan of care with the patient and I have advised the patient that an ED physician or mid-level practitioner will reevaluate their condition after the test results have been received, as the results may give them additional insight into the type of treatment they may need.    Diagnostics: None immediately.  Treatments: none immediately   Varney Daily, [place] 10/31/21 1805

## 2021-10-31 NOTE — ED Notes (Addendum)
Pt to ED for Bilateral leg pain and weakness that started today. Denies SOB and CP. Denies tenderness to Legs.   Pt going to sleep upon assessment, pt is oriented to self. Denies use of ETOH or drug use

## 2021-10-31 NOTE — ED Triage Notes (Signed)
Patient c/o bilateral hip and leg pain onset of today. Has been having knee pain for a couple months. Patient states he cannot walk however he jumped onto the stretcher in the waiting room.

## 2021-10-31 NOTE — ED Notes (Signed)
Upon discharge, pt states " you cant discharge me, I cant walk", Pt seen ambulating to bathroom multiple times without distress and assistance. ED provider Loralyn Freshwater at bedside stating pt is being discharged and has been assessed. Pt yells "well if your going to discharge me I'm going to kill myself, I'm going to run out in front of a truck and kill myself".   Charge RN Morrie Sheldon made aware

## 2021-11-01 DIAGNOSIS — F1410 Cocaine abuse, uncomplicated: Secondary | ICD-10-CM

## 2021-11-01 NOTE — ED Provider Notes (Signed)
Emergency Medicine Observation Re-evaluation Note  Jorge Maldonado is a 66 y.o. male, seen on rounds today.  Pt initially presented to the ED for complaints of Leg Pain  Currently, the patient is is no acute distress. Denies any concerns at this time.  Physical Exam  Blood pressure (!) 158/89, pulse 82, temperature 98.8 F (37.1 C), temperature source Oral, resp. rate (!) 25, height 6' (1.829 m), weight 74.8 kg, SpO2 95 %.  Physical Exam: General: No apparent distress Pulm: Normal WOB Neuro: Moving all extremities Psych: Resting comfortably     ED Course / MDM   Clinical Course as of 11/01/21 0714  Wed Oct 31, 2021  2224 Nurse attempted to discharge the patient to became angry.  He does not want to be discharged.  He stated that he could not walk but then sat up in the bed and turned his legs to the side and stated "I will just kill my .  I will just go jump out in front of a moving car."  Patient questioned as to why he was so angry and he states that his leg is hurting and we did not do anything for it.  Advised him that he had received Naprosyn.  He wants something different.  After this discussion, the patient walked out of his room and to the bathroom while being escorted by staff.  Plan will be to consult psychiatry for the threat of suicide although it seems that the patient just wants to be able to stay here and sleep. [CT]  Thu Nov 01, 2021  0030 Awaiting clearance by psychiatry.  Patient will be moved to main side of the ER and care transitioned to MD on that side. [CT]    Clinical Course User Index [CT] Triplett, Cari B, FNP    I have reviewed the labs performed to date as well as medications administered while in observation.  Recent changes in the last 24 hours include: No acute events overnight.  Plan   Current plan: Patient has been seen and cleared by psychiatry.  He is also medically cleared by previous provider.  He will be discharged in the morning with  resources. Patient is not under full IVC at this time.    Emalie Mcwethy, Layla Maw, DO 11/01/21 [phone]

## 2021-11-01 NOTE — ED Notes (Signed)
Pt continues to be cursing about not being in room or bed. States he is being treated inhumanly and is going to report this.

## 2021-11-01 NOTE — ED Notes (Signed)
Pt is aggravated with this nurse because he is in a chair, states this nusre is punishing him. Pt is requesting a bed that a pt was moved from to make room for another pt on the unit. Pt educated that the ER makes decisions and moves based on needs of pt. Pt states that he needs the bed. Pt reports that this nurse is rude and smart towards him. Pt reassured that this nurse not the case as needs of the department and patient safety are the priority. Pt unhappy with answer of not getting to go into bed.

## 2021-11-01 NOTE — BH Assessment (Signed)
This Clinical research associate provided pt's attending nurse Thayer Ohm, outpatient substance abuse and shelter resources for the pt upon being discharged.

## 2021-11-01 NOTE — ED Notes (Signed)
Pt given warm blanket per request.

## 2021-11-01 NOTE — Consult Note (Signed)
[facility] Face-to-Face Psychiatry Consult   Reason for Consult:Psychiatric Evaluation Referring Physician: Vanessa Bayfield, [place] Patient Identification: Jorge Maldonado MRN:  [id number] Principal Diagnosis: <principal problem not specified> Diagnosis:  Active Problems:   Amphetamine and psychostimulant-induced psychosis with delusions ([place])   Amphetamine abuse ([place])   Cocaine abuse ([place])   Alcohol abuse   Total Time spent with patient: 1 hour  Subjective: "I need to be checked out better." Jorge Maldonado is a 66 y.o. male patient presented to [facility] ED voluntarily with complaints of bilateral hip and leg pain onset of today. When the patient was discharged, he voiced, "You can't discharge me; I can't walk" The patient ambulated to the bathroom multiple times without distress and assistance. ED provider Vladimir Crofts at the bedside states that pt is being discharged and assessed. Pt yells, "Well, if you're going to discharge me, I'm going to kill myself; I'm going to run out in front of a truck and kill myself."  This provider saw The patient face-to-face; the chart was reviewed, and consulted with Dr. Leonides Schanz on 11/01/2021 due to the patient's care. It was discussed with the EDP that the patient could be discharged in the morning if he remained stable. UDS is positive for cocaine and cannabis.   On evaluation, the patient reports being alert and oriented x 4, euthymic, requesting a bed, and becoming irritated due to sleeping on a recliner; he is cooperative and mood-congruent with affect. The patient does not appear to be responding to internal or external stimuli. Neither is the patient presenting with any delusional thinking. The patient denies auditory and visual hallucinations. The patient denies any suicidal, homicidal, or self-harm ideations. The patient is not presenting with any psychotic or paranoid behaviors.   HPI: Per Vanessa Choptank, FNP; Jorge Maldonado is a 66 y.o. male with history of hypertension,  polysubstance abuse, and alcohol abuse presents to the emergency department for treatment and evaluation of bilateral leg pain.  Patient states that his legs just "gave out" today.  He denies associated injury.  States this is never happened to him before.  No alleviating measures attempted prior to arrival.  Past Psychiatric History: History reviewed. No pertinent past psychiatric history  Risk to Self:   Risk to Others:   Prior Inpatient Therapy:   Prior Outpatient Therapy:    Past Medical History:  Past Medical History:  Diagnosis Date   Hypertension     Past Surgical History:  Procedure Laterality Date   FRACTURE SURGERY     Family History: History reviewed. No pertinent family history. Family Psychiatric  History:  Social History:  Social History   Substance and Sexual Activity  Alcohol Use Yes   Comment: 4 beers a day     Social History   Substance and Sexual Activity  Drug Use Not Currently    Social History   Socioeconomic History   Marital status: Divorced    Spouse name: Not on file   Number of children: Not on file   Years of education: Not on file   Highest education level: Not on file  Occupational History   Not on file  Tobacco Use   Smoking status: Every Day    Packs/day: 1.00    Types: Cigarettes   Smokeless tobacco: Never  Vaping Use   Vaping Use: Never used  Substance and Sexual Activity   Alcohol use: Yes    Comment: 4 beers a day   Drug use: Not Currently   Sexual activity: Not on  file  Other Topics Concern   Not on file  Social History Narrative   Not on file   Social Determinants of Health   Financial Resource Strain: Not on file  Food Insecurity: Not on file  Transportation Needs: Not on file  Physical Activity: Not on file  Stress: Not on file  Social Connections: Not on file   Additional Social History:    Allergies:  No Known Allergies  Labs:  Results for orders placed or performed during the hospital encounter of  10/31/21 (from the past 48 hour(s))  CBC with Differential     Status: None   Collection Time: 10/31/21  6:20 PM  Result Value Ref Range   WBC 5.2 4.0 - 10.5 K/uL   RBC 4.63 4.22 - 5.81 MIL/uL   Hemoglobin 13.8 13.0 - 17.0 g/dL   HCT 51.8 84.1 - 66.0 %   MCV 89.4 80.0 - 100.0 fL   MCH 29.8 26.0 - 34.0 pg   MCHC 33.3 30.0 - 36.0 g/dL   RDW 63.0 16.0 - 10.9 %   Platelets 236 150 - 400 K/uL   nRBC 0.0 0.0 - 0.2 %   Neutrophils Relative % 58 %   Neutro Abs 3.0 1.7 - 7.7 K/uL   Lymphocytes Relative 25 %   Lymphs Abs 1.3 0.7 - 4.0 K/uL   Monocytes Relative 14 %   Monocytes Absolute 0.8 0.1 - 1.0 K/uL   Eosinophils Relative 2 %   Eosinophils Absolute 0.1 0.0 - 0.5 K/uL   Basophils Relative 1 %   Basophils Absolute 0.0 0.0 - 0.1 K/uL   Immature Granulocytes 0 %   Abs Immature Granulocytes 0.02 0.00 - 0.07 K/uL    Comment: Performed at [facility], [address] [place], [place] [address]  Comprehensive metabolic panel     Status: Abnormal   Collection Time: 10/31/21  6:20 PM  Result Value Ref Range   Sodium 141 135 - 145 mmol/L   Potassium 3.3 (L) 3.5 - 5.1 mmol/L   Chloride 106 98 - 111 mmol/L   CO2 22 22 - 32 mmol/L   Glucose, Bld 156 (H) 70 - 99 mg/dL    Comment: Glucose reference range applies only to samples taken after fasting for at least 8 hours.   BUN 15 8 - 23 mg/dL   Creatinine, Ser 7.32 0.61 - 1.24 mg/dL   Calcium 8.6 (L) 8.9 - 10.3 mg/dL   Total Protein 6.4 (L) 6.5 - 8.1 g/dL   Albumin 3.6 3.5 - 5.0 g/dL   AST 17 15 - 41 U/L   ALT 10 0 - 44 U/L   Alkaline Phosphatase 58 38 - 126 U/L   Total Bilirubin 0.7 0.3 - 1.2 mg/dL   GFR, Estimated >20 >25 mL/min    Comment: (NOTE) Calculated using the CKD-EPI Creatinine Equation (2021)    Anion gap 13 5 - 15    Comment: Performed at [facility], [address] [place], [place] [address]  Ethanol     Status: Abnormal   Collection Time: 10/31/21  6:20 PM  Result Value Ref Range   Alcohol, Ethyl  (B) 33 (H) <10 mg/dL    Comment: (NOTE) Lowest detectable limit for serum alcohol is 10 mg/dL.  For medical purposes only. Performed at [facility], [address] [place], [place] [address]   Urinalysis, Routine w reflex microscopic Urine, Clean Catch     Status: Abnormal   Collection Time: 10/31/21  8:30 PM  Result Value Ref Range   Color, Urine YELLOW (A) YELLOW   APPearance CLEAR (A) CLEAR   Specific Gravity, Urine 1.015 1.005 - 1.030   pH 5.0 5.0 - 8.0   Glucose, UA NEGATIVE NEGATIVE mg/dL   Hgb urine dipstick NEGATIVE NEGATIVE   Bilirubin Urine NEGATIVE NEGATIVE   Ketones, ur NEGATIVE NEGATIVE mg/dL   Protein, ur NEGATIVE NEGATIVE mg/dL   Nitrite NEGATIVE NEGATIVE   Leukocytes,Ua NEGATIVE NEGATIVE    Comment: Performed at [facility], [address], [place], [place] [address]  Urine Drug Screen, Qualitative ([facility] only)     Status: Abnormal   Collection Time: 10/31/21  8:30 PM  Result Value Ref Range   Tricyclic, Ur Screen NONE DETECTED NONE DETECTED   Amphetamines, Ur Screen NONE DETECTED NONE DETECTED   MDMA (Ecstasy)Ur Screen NONE DETECTED NONE DETECTED   Cocaine Metabolite,Ur [place] POSITIVE (A) NONE DETECTED   Opiate, Ur Screen NONE DETECTED NONE DETECTED   Phencyclidine (PCP) Ur S NONE DETECTED NONE DETECTED   Cannabinoid 50 Ng, Ur [place] POSITIVE (A) NONE DETECTED   Barbiturates, Ur Screen NONE DETECTED NONE DETECTED   Benzodiazepine, Ur Scrn NONE DETECTED NONE DETECTED   Methadone Scn, Ur NONE DETECTED NONE DETECTED    Comment: (NOTE) Tricyclics + metabolites, urine    Cutoff 1000 ng/mL Amphetamines + metabolites, urine  Cutoff 1000 ng/mL MDMA (Ecstasy), urine              Cutoff 500 ng/mL Cocaine Metabolite, urine          Cutoff 300 ng/mL Opiate + metabolites, urine        Cutoff 300 ng/mL Phencyclidine (PCP), urine         Cutoff 25 ng/mL Cannabinoid, urine                 Cutoff 50 ng/mL Barbiturates + metabolites, urine  Cutoff 200  ng/mL Benzodiazepine, urine              Cutoff 200 ng/mL Methadone, urine                   Cutoff 300 ng/mL  The urine drug screen provides only a preliminary, unconfirmed analytical test result and should not be used for non-medical purposes. Clinical consideration and professional judgment should be applied to any positive drug screen result due to possible interfering substances. A more specific alternate chemical method must be used in order to obtain a confirmed analytical result. Gas chromatography / mass spectrometry (GC/MS) is the preferred confirm atory method. Performed at [facility], [address] [place], [place] [address]     No current facility-administered medications for this encounter.   No current outpatient medications on file.    Musculoskeletal: Strength & Muscle Tone: within normal limits Gait & Station: normal Patient leans: N/A  Psychiatric Specialty Exam:  Presentation  General Appearance: Disheveled  Eye Contact:Good  Speech:Clear and Coherent  Speech Volume:Normal  Handedness:Right   Mood and Affect  Mood:Euthymic  Affect:Appropriate   Thought Process  Thought Processes:Coherent  Descriptions of Associations:Intact  Orientation:Full (Time, Place and Person)  Thought Content:Logical  History of Schizophrenia/Schizoaffective disorder:No  Duration of Psychotic Symptoms:No data recorded Hallucinations:Hallucinations: None  Ideas of Reference:None  Suicidal Thoughts:Suicidal Thoughts: Yes, Passive SI Passive Intent and/or Plan: Without Means to Carry Out; Without Intent  Homicidal Thoughts:Homicidal Thoughts: No   Sensorium  Memory:Immediate Good; Recent Good; Remote Good  Judgment:Poor  Insight:Poor   Executive Functions  Concentration:Good  Attention  Span:Good  [place] of Knowledge:Good  Language:Good   Psychomotor Activity  Psychomotor Activity:Psychomotor Activity:  Normal   Assets  Assets:Housing; Physical Health   Sleep  Sleep:Sleep: Fair   Physical Exam: Physical Exam Vitals and nursing note reviewed.  Constitutional:      Appearance: Normal appearance. He is normal weight.  HENT:     Head: Normocephalic and atraumatic.     Right Ear: External ear normal.     Left Ear: External ear normal.     Nose: Nose normal.     Mouth/Throat:     Mouth: Mucous membranes are moist.  Cardiovascular:     Rate and Rhythm: Normal rate.     Pulses: Normal pulses.  Musculoskeletal:        General: Normal range of motion.     Cervical back: Normal range of motion and neck supple.  Neurological:     General: No focal deficit present.     Mental Status: He is alert and oriented to person, place, and time.  Psychiatric:        Attention and Perception: Attention and perception normal.        Mood and Affect: Mood and affect normal.        Speech: Speech normal.        Behavior: Behavior normal. Behavior is cooperative.        Thought Content: Thought content normal.        Cognition and Memory: Cognition and memory normal.        Judgment: Judgment normal.    Review of Systems  Psychiatric/Behavioral:  Positive for substance abuse.   All other systems reviewed and are negative.  Blood pressure (!) 158/89, pulse 82, temperature 98.8 F (37.1 C), temperature source Oral, resp. rate (!) 25, height 6' (1.829 m), weight 74.8 kg, SpO2 95 %. Body mass index is 22.38 kg/m.  Treatment Plan Summary: Plan The patient could be discharged in the morning if he remained stable.  Disposition: No evidence of imminent risk to self or others at present.   Patient does not meet criteria for psychiatric inpatient admission. Supportive therapy provided about ongoing stressors. Refer to IOP. Discussed crisis plan, support from social network, calling 911, coming to the Emergency Department, and calling Suicide Hotline.  Caroline Sauger, NP 11/01/2021 1:59  AM

## 2021-11-01 NOTE — ED Provider Notes (Signed)
Patient again saying he cannot walk although he has been seen walking repeatedly.  And he said again he will go and jump in front of a car I discussed him with psychiatry he has been cleared he is felt to be malingering he wants something apparently in the way of a opioid for his leg pain instead of the Naprosyn which we will not give him.  I do not have anything else to offer him at this time.  He has been given resources to help him take care of himself and work on his addictions.  Will discharge him.   Arnaldo Natal, MD 11/01/21 [phone]

## 2021-11-01 NOTE — ED Notes (Signed)
Patient transferred from room 54 to room [facility] after dressing out and screening for contraband. Report received from [facility] including situation, background, assessment and recommendations. Pt oriented to [employer] including Q15 minute rounds as well as Psychologist, counselling for their protection. Patient is alert and oriented, warm and dry in no acute distress. Patient denies HI and AVH. Pt. Encouraged to let this nurse know if needs arise.

## 2021-11-01 NOTE — BH Assessment (Signed)
Comprehensive Clinical Assessment (CCA) Screening, Triage and Referral Note  11/01/2021 COLSON BARCO [id number] Recommendations for Services/Supports/Treatments: Consulted with Madaline Brilliant., NP, who recommended pt. be observed overnight and discharged in the AM with resources. Notified Dr. Elesa Massed and Thayer Ohm, RN of disposition recommendation. Leeandre R. Flury is a 66 year old., Caucasian, Non-Hispanic, English speaking male with amphetamine and psychostimulant-induced psychosis with delusions. Pt also has a hx of amphetamine abuse, cocaine abuse, and alcohol abuse. Pt is voluntary. Per triage note: Upon discharge, pt states " you can't discharge me, I can't walk", Pt seen ambulating to bathroom multiple times without distress and assistance. ED provider Loralyn Freshwater at bedside stating pt is being discharged and has been assessed. Pt yells "well if you're going to discharge me I'm going to kill myself, I'm going to run out in front of a truck and kill myself". Upon assessment, Pt was calm and cooperative. Pt verbalized that he needs help with his mental health. Pt admitted to being easily agitated, reporting that he has a lot on his mind. Pt was goal directed and requested a bed due to being in the hallway. Pt admitted that he'd had SI with a plan to jump in front of a truck prior to being discharged. Pt was able to report that he is homeless due to burning all of his bridges as a result of his substance use. Pt denied any substance use prior to arrival. Pt admitted to using crack on Monday 10/29/21. Pt reported that he uses as often as he can. Pt explained that he wants some help. Pt had fair insight and lacking judgment. Pt had an irritable mood and a congruent affect. Pt made minimal eye contact. Pt's UDS + for cocaine. Chief Complaint:  Chief Complaint  Patient presents with   Leg Pain   Visit Diagnosis: Cocaine abuse [facility])  Patient Reported Information How did you hear about [place]? Self  What Is the Reason  for Your Visit/Call Today? Upon discharge, pt states " you cant discharge me, I cant walk", Pt seen ambulating to bathroom multiple times without distress and assistance. ED provider Loralyn Freshwater at bedside stating pt is being discharged and has been assessed. Pt yells "well if your going to discharge me I'm going to kill myself, I'm going to run out in front of a truck and kill myself".  How Long Has This Been Causing You Problems? <Week  What Do You Feel Would Help You the Most Today? Alcohol or Drug Use Treatment   Have You Recently Had Any Thoughts About Hurting Yourself? Yes  Are You Planning to Commit Suicide/Harm Yourself At This time? No   Have you Recently Had Thoughts About Hurting Someone Karolee Ohs? No  Are You Planning to Harm Someone at This Time? No  Explanation: No data recorded  Have You Used Any Alcohol or Drugs in the Past 24 Hours? No  How Long Ago Did You Use Drugs or Alcohol? No data recorded What Did You Use and How Much? n/a   Do You Currently Have a Therapist/Psychiatrist? No  Name of Therapist/Psychiatrist: No data recorded  Have You Been Recently Discharged From Any Office Practice or Programs? No  Explanation of Discharge From Practice/Program: No data recorded   CCA Screening Triage Referral Assessment Type of Contact: Face-to-Face  Telemedicine Service Delivery:   Is this Initial or Reassessment? No data recorded Date Telepsych consult ordered in [facility]:  No data recorded Time Telepsych consult ordered in [facility]:  No data recorded Location of Assessment:  [facility] ED  Provider Location: [facility] ED   Collateral Involvement: None provided   Does Patient Have a Court Appointed Legal Guardian? No data recorded Name and Contact of Legal Guardian: No data recorded If Minor and Not Living with Parent(s), Who has Custody? n/a  Is CPS involved or ever been involved? Never  Is APS involved or ever been involved? Never   Patient Determined To Be At Risk for Harm To  Self or Others Based on Review of Patient Reported Information or Presenting Complaint? No  Method: No data recorded Availability of Means: No data recorded Intent: No data recorded Notification Required: No data recorded Additional Information for Danger to Others Potential: No data recorded Additional Comments for Danger to Others Potential: No data recorded Are There Guns or Other Weapons in Your Home? No data recorded Types of Guns/Weapons: No data recorded Are These Weapons Safely Secured?                            No data recorded Who Could Verify You Are Able To Have These Secured: No data recorded Do You Have any Outstanding Charges, Pending Court Dates, Parole/Probation? No data recorded Contacted To Inform of Risk of Harm To Self or Others: No data recorded  Does Patient Present under Involuntary Commitment? No  IVC Papers Initial File Date: No data recorded  [place] of Residence: [place]   Patient Currently Receiving the Following Services: Not Receiving Services   Determination of Need: Emergent (2 hours)   Options For Referral: ED Visit; Outpatient Therapy   Discharge Disposition:     Georges Victorio R Marcial Pless, LCAS

## 2021-11-01 NOTE — ED Notes (Signed)
Pt given sandwich tray and ginger ale per request 

## 2021-11-01 NOTE — ED Notes (Signed)
VOL/pending discharge in the AM.

## 2021-11-01 NOTE — ED Notes (Signed)
Pt states that he has SI with no plan. Reports that he was going to be discharged with out the providers assessing him for pneumonia so he stated he was going to kill himself. Pt reports to this nurse that he was not suicidal until they said he needed to be discharged. Pt is demanding, complains of situation due to being in hallway, in recliner, and not immediately having blanket or pillow when arriving to area.

## 2022-01-01 ENCOUNTER — Other Ambulatory Visit: Payer: Self-pay

## 2022-01-01 ENCOUNTER — Encounter: Payer: Self-pay | Admitting: Emergency Medicine

## 2022-01-01 ENCOUNTER — Emergency Department
Admission: EM | Admit: 2022-01-01 | Discharge: 2022-01-01 | Discharge status: Against Medical Advice | Payer: No Typology Code available for payment source | Attending: Emergency Medicine | Admitting: Emergency Medicine

## 2022-01-01 DIAGNOSIS — Z5321 Procedure and treatment not carried out due to patient leaving prior to being seen by health care provider: Secondary | ICD-10-CM | POA: Diagnosis not present

## 2022-01-01 DIAGNOSIS — F1120 Opioid dependence, uncomplicated: Secondary | ICD-10-CM | POA: Insufficient documentation

## 2022-01-01 NOTE — ED Notes (Signed)
Sent purple, light green, and red tubes to lab.

## 2022-01-01 NOTE — ED Triage Notes (Addendum)
Arrives states "I have a terrible heroin habit."  Wishes to see a physician, wishes to go to detox.  States has a friend that has a detox place in [place].  Denies current SI/HI, unless he runs out of heroin.  Last used 2 hours ago.  AAOx3.  Skin warm and dry.  NAD.  Calm and cooperative.

## 2022-01-01 NOTE — ED Notes (Signed)
Pt ambulatory to desk, pt reports that he no longer wishes to wait. Denies SI/HI. Pt advised to return to ED if he has worsening sx's. Also pt was given information for [facility] that may be of some help.

## 2022-01-01 NOTE — ED Provider Triage Note (Signed)
Emergency Medicine Provider Triage Evaluation Note  Jorge Maldonado , a 66 y.o. male  was evaluated in triage.  Pt complains of heroin addiction.  Wants to be seen for detox.  Last used 2 hours ago.  Review of Systems  Positive:  Negative:   Physical Exam  Ht 6' (1.829 m)   Wt 74.8 kg   BMI 22.37 kg/m  Gen:   Awake, no distress  talkative and cooperative.  Resp:  Normal effort  MSK:   Moves extremities without difficulty  Other:    Medical Decision Making  Medically screening exam initiated at 2:40 PM.  Appropriate orders placed.  MICHIAL DISNEY was informed that the remainder of the evaluation will be completed by another provider, this initial triage assessment does not replace that evaluation, and the importance of remaining in the ED until their evaluation is complete.     Johnn Hai, PA-C 01/01/22 1442

## 2022-02-26 ENCOUNTER — Other Ambulatory Visit: Payer: Self-pay

## 2022-02-26 ENCOUNTER — Emergency Department
Admission: EM | Admit: 2022-02-26 | Discharge: 2022-02-27 | Disposition: A | Discharge status: Other Acute Inpatient Hospital | Payer: Medicare Other | Attending: Emergency Medicine | Admitting: Emergency Medicine

## 2022-02-26 ENCOUNTER — Encounter: Payer: Self-pay | Admitting: Emergency Medicine

## 2022-02-26 DIAGNOSIS — F1210 Cannabis abuse, uncomplicated: Secondary | ICD-10-CM | POA: Diagnosis not present

## 2022-02-26 DIAGNOSIS — R45851 Suicidal ideations: Secondary | ICD-10-CM

## 2022-02-26 DIAGNOSIS — Z20822 Contact with and (suspected) exposure to covid-19: Secondary | ICD-10-CM | POA: Diagnosis not present

## 2022-02-26 DIAGNOSIS — F29 Unspecified psychosis not due to a substance or known physiological condition: Secondary | ICD-10-CM | POA: Insufficient documentation

## 2022-02-26 DIAGNOSIS — F1010 Alcohol abuse, uncomplicated: Secondary | ICD-10-CM | POA: Diagnosis present

## 2022-02-26 DIAGNOSIS — Z79899 Other long term (current) drug therapy: Secondary | ICD-10-CM | POA: Insufficient documentation

## 2022-02-26 DIAGNOSIS — F1410 Cocaine abuse, uncomplicated: Secondary | ICD-10-CM | POA: Diagnosis present

## 2022-02-26 DIAGNOSIS — Y902 Blood alcohol level of 40-59 mg/100 ml: Secondary | ICD-10-CM | POA: Diagnosis not present

## 2022-02-26 DIAGNOSIS — F1290 Cannabis use, unspecified, uncomplicated: Secondary | ICD-10-CM | POA: Diagnosis present

## 2022-02-26 LAB — COMPREHENSIVE METABOLIC PANEL
ALT: 11 U/L (ref 0–44)
AST: 18 U/L (ref 15–41)
Albumin: 4 g/dL (ref 3.5–5.0)
Alkaline Phosphatase: 72 U/L (ref 38–126)
Anion gap: 10 (ref 5–15)
BUN: 11 mg/dL (ref 8–23)
CO2: 21 mmol/L — ABNORMAL LOW (ref 22–32)
Calcium: 9.3 mg/dL (ref 8.9–10.3)
Chloride: 106 mmol/L (ref 98–111)
Creatinine, Ser: 0.65 mg/dL (ref 0.61–1.24)
GFR, Estimated: >60 mL/min (ref >60)
Glucose, Bld: 148 mg/dL — ABNORMAL HIGH (ref 70–99)
Potassium: 3.5 mmol/L (ref 3.5–5.1)
Sodium: 137 mmol/L (ref 135–145)
Total Bilirubin: 0.8 mg/dL (ref 0.3–1.2)
Total Protein: 7.5 g/dL (ref 6.5–8.1)

## 2022-02-26 LAB — URINE DRUG SCREEN, QUALITATIVE (ARMC ONLY)
Amphetamines, Ur Screen: NOT DETECTED
Barbiturates, Ur Screen: NOT DETECTED
Benzodiazepine, Ur Scrn: NOT DETECTED
Cannabinoid 50 Ng, Ur ~~LOC~~: NOT DETECTED
Cocaine Metabolite,Ur ~~LOC~~: POSITIVE — AB
MDMA (Ecstasy)Ur Screen: NOT DETECTED
Methadone Scn, Ur: NOT DETECTED
Opiate, Ur Screen: NOT DETECTED
Phencyclidine (PCP) Ur S: NOT DETECTED
Tricyclic, Ur Screen: NOT DETECTED

## 2022-02-26 LAB — SALICYLATE LEVEL: Salicylate Lvl: <7 mg/dL — ABNORMAL LOW (ref 7.0–30.0)

## 2022-02-26 LAB — CBC
HCT: 45.9 % (ref 39.0–52.0)
Hemoglobin: 14.8 g/dL (ref 13.0–17.0)
MCH: 29 pg (ref 26.0–34.0)
MCHC: 32.2 g/dL (ref 30.0–36.0)
MCV: 90 fL (ref 80.0–100.0)
Platelets: 365 10*3/uL (ref 150–400)
RBC: 5.1 MIL/uL (ref 4.22–5.81)
RDW: 13 % (ref 11.5–15.5)
WBC: 10.7 10*3/uL — ABNORMAL HIGH (ref 4.0–10.5)
nRBC: 0 % (ref 0.0–0.2)

## 2022-02-26 LAB — SARS CORONAVIRUS 2 BY RT PCR: SARS Coronavirus 2 by RT PCR: NEGATIVE

## 2022-02-26 LAB — ETHANOL: Alcohol, Ethyl (B): 54 mg/dL — ABNORMAL HIGH (ref <10)

## 2022-02-26 LAB — ACETAMINOPHEN LEVEL: Acetaminophen (Tylenol), Serum: <10 ug/mL — ABNORMAL LOW (ref 10–30)

## 2022-02-26 MED ORDER — LORAZEPAM 2 MG PO TABS: 2.0000 mg | ORAL_TABLET | Freq: Once | ORAL | Status: DC

## 2022-02-26 MED ORDER — ONDANSETRON 4 MG PO TBDP
4.0000 mg | ORAL_TABLET | Freq: Four times a day (QID) | ORAL | Status: DC | PRN
  Administered 2022-02-27: 4 mg via ORAL
  Filled 2022-02-26: qty 1

## 2022-02-26 MED ORDER — LOPERAMIDE HCL 2 MG PO CAPS: 2.0000 mg | ORAL_CAPSULE | ORAL | Status: DC | PRN

## 2022-02-26 MED ORDER — ADULT MULTIVITAMIN W/MINERALS CH
1.0000 | ORAL_TABLET | Freq: Every day | ORAL | Status: DC
  Administered 2022-02-27: 1 via ORAL
  Filled 2022-02-26: qty 1

## 2022-02-26 MED ORDER — THIAMINE HCL 100 MG/ML IJ SOLN: 100.0000 mg | Freq: Once | INTRAMUSCULAR | Status: DC

## 2022-02-26 MED ORDER — LORAZEPAM 1 MG PO TABS: 1.0000 mg | ORAL_TABLET | Freq: Four times a day (QID) | ORAL | Status: DC | PRN

## 2022-02-26 MED ORDER — THIAMINE MONONITRATE 100 MG PO TABS
100.0000 mg | ORAL_TABLET | Freq: Every day | ORAL | Status: DC
  Administered 2022-02-27: 100 mg via ORAL
  Filled 2022-02-26: qty 1

## 2022-02-26 MED ORDER — HYDROXYZINE HCL 25 MG PO TABS: 25.0000 mg | ORAL_TABLET | Freq: Four times a day (QID) | ORAL | Status: DC | PRN

## 2022-02-26 NOTE — ED Provider Notes (Signed)
   [facility] Provider Note    Event Date/Time   First MD Initiated Contact with Patient 02/26/22 1527     (approximate)   History   Suicidal   HPI  Jorge Maldonado is a 66 y.o. male with hypertension rheumatoid arthritis who reports his thoughts are racing and he cannot sleep.  He says he has not slept in 4 days.  He says he wants to kill himself.  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  He himself tells me he does not have any medical problems but the old records show a hypertension rheumatoid arthritis.  He says he feels well otherwise.      Physical Exam   Triage Vital Signs: ED Triage Vitals  Enc Vitals Group     BP 02/26/22 1509 (!) 146/80     Pulse Rate 02/26/22 1509 71     Resp 02/26/22 1509 18     Temp 02/26/22 1509 98.3 F (36.8 C)     Temp Source 02/26/22 1509 Oral     SpO2 02/26/22 1509 96 %     Weight --      Height --      Head Circumference --      Peak Flow --      Pain Score 02/26/22 1510 0     Pain Loc --      Pain Edu? --      Excl. in GC? --     Most recent vital signs: Vitals:   02/26/22 1509  BP: (!) 146/80  Pulse: 71  Resp: 18  Temp: 98.3 F (36.8 C)  SpO2: 96%    General: Awake, no distress.  CV:  Good peripheral perfusion.  Heart regular rate and rhythm no audible murmurs Resp:  Normal effort.  Lungs are clear Abd:  No distention.  Soft and nontender Extremities: No edema   ED Results / Procedures / Treatments   Labs (all labs ordered are listed, but only abnormal results are displayed) Labs Reviewed  COMPREHENSIVE METABOLIC PANEL  ETHANOL  SALICYLATE LEVEL  ACETAMINOPHEN LEVEL  CBC  URINE DRUG SCREEN, QUALITATIVE ([facility] ONLY)     EKG     RADIOLOGY    PROCEDURES:  Critical Care performed:   Procedures   MEDICATIONS ORDERED IN ED: Medications - No data to display   IMPRESSION / MDM / ASSESSMENT AND PLAN / ED COURSE  I reviewed the triage vital signs and the nursing notes.     The patient has been placed in psychiatric observation due to the need to provide a safe environment for the patient while obtaining psychiatric consultation and evaluation, as well as ongoing medical and medication management to treat the patient's condition. Differential diagnosis includes, but is not limited to, possible mania with not sleeping, bipolar illness, suicidal ideation, side effects of drug use  Patient's presentation is most consistent with acute presentation with potential threat to life or bodily function.    Believe patient voluntary at this time.   FINAL CLINICAL IMPRESSION(S) / ED DIAGNOSES   Final diagnoses:  Suicidal ideation     Rx / DC Orders   ED Discharge Orders     None        Note:  This document was prepared using Dragon voice recognition software and may include unintentional dictation errors.   Arnaldo Natal, MD 02/26/22 1537

## 2022-02-26 NOTE — ED Notes (Signed)
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

## 2022-02-26 NOTE — BH Assessment (Signed)
Comprehensive Clinical Assessment (CCA) Note  02/26/2022 Jorge Maldonado [id number]  Jorge Maldonado, 66 year old male who presents to [facility] ED voluntarily for treatment. Per triage note, Patient to ED for suicidal ideations. Patient states "my mind keeps racing, and I can't sleep." Patient has plans to jump off bridge. Cooperative in triage.   During TTS assessment pt presents alert and oriented x 4, restless but cooperative, and mood-congruent with affect. The pt does not appear to be responding to internal or external stimuli. Neither is the pt presenting with any delusional thinking. Pt verified the information provided to triage RN.   Pt identifies his main complaint to be that he wants to kill himself. Patient reports having racing thoughts and not being able to sleep for 4 days. Patient reports symptoms of depression, worthlessness, hopelessness, and a plan to jump in front of a car. Patient reports he is not working, receives disability and living with his sister. Patient admits to daily drug use of cocaine and heroin and drinking 4-5 wine bottles a day. He feels as though he has nothing to live for. Patient reports his eating and sleep habits have worsened. Pt reports no prior INPT or OPT hx. Pt denies current HI/AH/VH. Patient states he came to the hospital with hopes of getting some help.    Psych Team spoke with patient's sister, Jorge Maldonado whom he lives with [phone]. Jorge Maldonado states patient has worsened over the past couple of months. Debbie reports patient speaks of suicide often and she is very worried about his safety. She is aware of his poly substance use for several years.    Per Barbaraann Share, NP, pt is recommended for inpatient psychiatric admission.    Chief Complaint:  Chief Complaint  Patient presents with   Suicidal   Visit Diagnosis: Suicidal ideation    CCA Screening, Triage and Referral (STR)  Patient Reported Information How did you hear about [place]? Self  Referral name:  No data recorded Referral phone number: No data recorded  Whom do you see for routine medical problems? No data recorded Practice/Facility Name: No data recorded Practice/Facility Phone Number: No data recorded Name of Contact: No data recorded Contact Number: No data recorded Contact Fax Number: No data recorded Prescriber Name: No data recorded Prescriber Address (if known): No data recorded  What Is the Reason for Your Visit/Call Today? Patient reports SI with plan to jump in front of a car or overdose.  How Long Has This Been Causing You Problems? > than 6 months  What Do You Feel Would Help You the Most Today? Alcohol or Drug Use Treatment; Treatment for Depression or other mood problem; Medication(s)   Have You Recently Been in Any Inpatient Treatment (Hospital/Detox/Crisis Center/28-Day Program)? No data recorded Name/Location of Program/Hospital:No data recorded How Long Were You There? No data recorded When Were You Discharged? No data recorded  Have You Ever Received Services From [facility] Before? No data recorded Who Do You [facility] at [facility]? No data recorded  Have You Recently Had Any Thoughts About Hurting Yourself? Yes  Are You Planning to Commit Suicide/Harm Yourself At This time? No   Have you Recently Had Thoughts About [place]? No  Explanation: No data recorded  Have You Used Any Alcohol or Drugs in the Past 24 Hours? Yes  How Long Ago Did You Use Drugs or Alcohol? No data recorded What Did You Use and How Much? 4-5 bottles of wine   Do You Currently Have a  Therapist/Psychiatrist? No  Name of Therapist/Psychiatrist: No data recorded  Have You Been Recently Discharged From Any Office Practice or Programs? No  Explanation of Discharge From Practice/Program: No data recorded    CCA Screening Triage Referral Assessment Type of Contact: Face-to-Face  Is this Initial or Reassessment? No data recorded Date Telepsych consult ordered in  [facility]:  No data recorded Time Telepsych consult ordered in [facility]:  No data recorded  Patient Reported Information Reviewed? No data recorded Patient Left Without Being Seen? No data recorded Reason for Not Completing Assessment: No data recorded  Collateral Involvement: Jorge Maldonado [phone]   Does Patient Have a Court Appointed Legal Guardian? No data recorded Name and Contact of Legal Guardian: No data recorded If Minor and Not Living with Parent(s), Who has Custody? n/a  Is CPS involved or ever been involved? Never  Is APS involved or ever been involved? Never   Patient Determined To Be At Risk for Harm To Self or Others Based on Review of Patient Reported Information or Presenting Complaint? Yes, for Self-Harm  Method: Plan without intent  Availability of Means: No data recorded Intent: Clearly intends on inflicting harm that could cause death  Notification Required: Identifiable person is aware  Additional Information for Danger to Others Potential: No data recorded Additional Comments for Danger to Others Potential: No data recorded Are There Guns or Other Weapons in Your Home? No data recorded Types of Guns/Weapons: No data recorded Are These Weapons Safely Secured?                            No data recorded Who Could Verify You Are Able To Have These Secured: No data recorded Do You Have any Outstanding Charges, Pending Court Dates, Parole/Probation? No data recorded Contacted To Inform of Risk of Harm To Self or Others: No data recorded  Location of Assessment: [facility] ED   Does Patient Present under Involuntary Commitment? No  IVC Papers Initial File Date: No data recorded  [place] of Residence: [place]   Patient Currently Receiving the Following Services: Not Receiving Services   Determination of Need: Emergent (2 hours)   Options For Referral: ED Visit; Inpatient Hospitalization; Medication Management; Chemical Dependency Intensive Outpatient Therapy  (CDIOP)     CCA Biopsychosocial Intake/Chief Complaint:  No data recorded Current Symptoms/Problems: No data recorded  Patient Reported Schizophrenia/Schizoaffective Diagnosis in Past: No   Strengths: Patient able to communicate and verbalize needs.  Preferences: No data recorded Abilities: No data recorded  Type of Services Patient Feels are Needed: No data recorded  Initial Clinical Notes/Concerns: No data recorded  Mental Health Symptoms Depression:   Hopelessness; Increase/decrease in appetite; Worthlessness; Difficulty Concentrating; Sleep (too much or little)   Duration of Depressive symptoms:  Greater than two weeks   Mania:   None   Anxiety:    Difficulty concentrating; Fatigue; Restlessness; Worrying; Tension; Sleep   Psychosis:   None   Duration of Psychotic symptoms: No data recorded  Trauma:   None   Obsessions:   None   Compulsions:   None   Inattention:   None   Hyperactivity/Impulsivity:   None   Oppositional/Defiant Behaviors:   None   Emotional Irregularity:   Chronic feelings of emptiness; Recurrent suicidal behaviors/gestures/threats   Other Mood/Personality Symptoms:  No data recorded   Mental Status Exam Appearance and self-care  Stature:   Average   Weight:   Average weight   Clothing:  Disheveled   Grooming:   Neglected   Cosmetic use:   None   Posture/gait:   Slumped   Motor activity:   Not Remarkable   Sensorium  Attention:   Normal   Concentration:   Normal   Orientation:   X5   Recall/memory:   Normal   Affect and Mood  Affect:   Depressed; Anxious   Mood:   Depressed; Anxious; Worthless; Hopeless   Relating  Eye contact:   Normal   Facial expression:   Depressed; Sad   Attitude toward examiner:   Cooperative   Thought and Language  Speech flow:  Clear and Coherent   Thought content:   Appropriate to Mood and Circumstances   Preoccupation:   None   Hallucinations:    None   Organization:  No data recorded  [employer] of Knowledge:   Average   Intelligence:   Average   Abstraction:   Normal   Judgement:   Impaired   Reality Testing:   Realistic   Insight:   Fair   Decision Making:   Impulsive   Social Functioning  Social Maturity:   Irresponsible   Social Judgement:   "Street Smart"   Stress  Stressors:   Transitions; Illness   Coping Ability:   Exhausted   Skill Deficits:   Decision making; Self-control; Responsibility   Supports:   Family     Religion:    Leisure/Recreation:    Exercise/Diet: Exercise/Diet Do You Have Any Trouble Sleeping?: Yes Explanation of Sleeping Difficulties: Patient reports he has not slept in 4 days.   CCA Employment/Education Employment/Work Situation: Employment / Work Technical sales engineer: On disability  Education: Education Is Patient Currently Attending School?: No   CCA Family/Childhood History Family and Relationship History:    Childhood History:     Child/Adolescent Assessment:     CCA Substance Use Alcohol/Drug Use: Alcohol / Drug Use Pain Medications: See PTA Prescriptions: See PTA Over the Counter: See PTA History of alcohol / drug use?: Yes Longest period of sobriety (when/how long): Unknown Negative Consequences of Use: Financial, Personal relationships Substance #1 Name of Substance 1: Cocaine 1 - Frequency: daily Substance #2 Name of Substance 2: Heroin 2 - Frequency: daily                     ASAM's:  Six Dimensions of Multidimensional Assessment  Dimension 1:  Acute Intoxication and/or Withdrawal Potential:      Dimension 2:  Biomedical Conditions and Complications:      Dimension 3:  Emotional, Behavioral, or Cognitive Conditions and Complications:     Dimension 4:  Readiness to Change:     Dimension 5:  Relapse, Continued use, or Continued Problem Potential:     Dimension 6:  Recovery/Living  Environment:     ASAM Severity Score:    ASAM Recommended Level of Treatment:     Substance use Disorder (SUD) Substance Use Disorder (SUD)  Checklist Symptoms of Substance Use: Continued use despite having a persistent/recurrent physical/psychological problem caused/exacerbated by use, Continued use despite persistent or recurrent social, interpersonal problems, caused or exacerbated by use, Evidence of tolerance, Evidence of withdrawal (Comment), Persistent desire or unsuccessful efforts to cut down or control use, Recurrent use that results in a failure to fulfill major role obligations (work, school, home), Substance(s) often taken in larger amounts or over longer times than was intended  Recommendations for Services/Supports/Treatments: Recommendations for Services/Supports/Treatments Recommendations For Services/Supports/Treatments: Medication Management, Inpatient Hospitalization  DSM5 Diagnoses: Patient Active Problem List   Diagnosis Date Noted   Suicidal ideation 02/26/2022   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

## 2022-02-26 NOTE — BH Assessment (Signed)
Adult MH  Referral information for Psychiatric Hospitalization faxed to:   [facility] ([phone]-or- [phone]),   [facility] ([phone]),   [facility] ([phone] -or- [phone]),   [facility] (-[phone] -or- [phone]) [phone]   [place] ([phone]---[phone]---[phone]),   [facility] ([phone] or [phone])   [facility] ([phone] or [phone]),   [facility] ([phone]). 

## 2022-02-26 NOTE — ED Notes (Signed)
Pt accepted to [facility] for tomorrow after 8am.

## 2022-02-26 NOTE — ED Notes (Signed)
EDP Malinda to bedside.  

## 2022-02-26 NOTE — ED Notes (Signed)
Called dietary team to notify pt will need dinner tray. State they will send one soon.

## 2022-02-26 NOTE — ED Triage Notes (Signed)
Patient to ED for suicidal ideations. Patient states "my mind keeps racing and I can't sleep." Patient has plans to jump off bridge. Cooperative in triage.

## 2022-02-26 NOTE — ED Notes (Signed)
Psych/counseling staff to bedside.

## 2022-02-26 NOTE — ED Notes (Signed)
Pt up to restroom.

## 2022-02-26 NOTE — ED Notes (Signed)
IVC to [facility] 12/6 am

## 2022-02-26 NOTE — ED Notes (Signed)
Pt ambulatory to room; steady; calm and cooperative with security.

## 2022-02-26 NOTE — BH Assessment (Signed)
Patient has been accepted to [facility] on tomm 02/27/22 after 8:00am.pending negative Covid results.  Patient assigned to [facility] Accepting physician is Dr. Estill Cotta.  Call report to [phone].  Representative was [employer].   ER Staff is aware of it:  Luann, ER Secretary  Dr. Darnelle Catalan, ER MD  [place], Patient's Nurse

## 2022-02-26 NOTE — ED Notes (Signed)
Pt denies recent life changes or stressors, states chronic intermittent trouble with sleeping; states goes months at a time with good sleep then cannot sleep; states long history of SI; states uses heroine and drinking alcohol; last use of both this morning per pt; pt denies withdrawal symptoms; plan is to jump off bridge; agreed to let this RN know if he starts to experience any withdrawal symptoms; pt given water and food tray by provider Malinda; pt hasn't touched food tray stating he is not hungry; lights dimmed at pt's request; door cracked halfway open. Pt calm and cooperative. Pt's resp reg/unlabored and skin dry.

## 2022-02-26 NOTE — Consult Note (Signed)
[facility] Face-to-Face Psychiatry Consult   Reason for Consult: Suicidal ideation Referring Physician: Darnelle Catalan Patient Identification: Jorge Maldonado MRN:  [id number] Principal Diagnosis: Suicidal ideation Diagnosis:  Principal Problem:   Suicidal ideation Active Problems:   Cocaine abuse (HCC)   Alcohol abuse   Total Time spent with patient: 30 minutes  Subjective: "I want to kill myself; I have nothing to live for."    Jorge Maldonado is a 66 y.o. male patient admitted with suicidal ideation.  HPI:  Patient presents to ED with suicidal thoughts with plan to jump in front of a car or over a bridge.  On evaluation, patient is lying in bed with a very flat, depressed affect he states that he has a plan to run in front of a car or jump off a bridge.  He is alert and oriented x 4.  Denies auditory or visual hallucinations and does not appear to be responding to internal stimuli.  Denies homicidal ideation.  He reports he feels that he has nothing in his life to live for.  Describes anhedonia.  He says that he uses cocaine, heroin, along with alcohol daily, 4-5 bottles of beer a day.  He comes in saying that he has had no sleep for 4 days.  Patient said his appetite is poor.  He says he is tired of living like this and he came to the hospital to get help and stated doing something to kill himself.  UDS is positive for cocaine; BAL 54  Collateral from sister, Eunice Blase, whom he lives with.  [phone]).  Sister states that she has "never seen him like this."  She is afraid that he is going to do something to hurt himself or kill himself.  She is aware that he has used drugs for many years and admits that this does have something to do with that, but she says "I am very worried that he will kill himself."  He has threatened to take an overdose on purpose or step in front of a car.      Past Psychiatric History: Suicide attempt via intentional overdose about 10 years ago.  Substance abuse.  Risk to  Self:   Risk to Others:   Prior Inpatient Therapy:   Prior Outpatient Therapy:    Past Medical History:  Past Medical History:  Diagnosis Date   Hypertension     Past Surgical History:  Procedure Laterality Date   FRACTURE SURGERY     Family History: History reviewed. No pertinent family history. Family Psychiatric  History: Denies Social History:  Social History   Substance and Sexual Activity  Alcohol Use Yes   Comment: 4 beers a day     Social History   Substance and Sexual Activity  Drug Use Not Currently    Social History   Socioeconomic History   Marital status: Divorced    Spouse name: Not on file   Number of children: Not on file   Years of education: Not on file   Highest education level: Not on file  Occupational History   Not on file  Tobacco Use   Smoking status: Every Day    Packs/day: 1.00    Types: Cigarettes   Smokeless tobacco: Never  Vaping Use   Vaping Use: Never used  Substance and Sexual Activity   Alcohol use: Yes    Comment: 4 beers a day   Drug use: Not Currently   Sexual activity: Not on file  Other Topics Concern  Not on file  Social History Narrative   Not on file   Social Determinants of Health   Financial Resource Strain: Not on file  Food Insecurity: Not on file  Transportation Needs: Not on file  Physical Activity: Not on file  Stress: Not on file  Social Connections: Not on file   Additional Social History:    Allergies:  No Known Allergies  Labs:  Results for orders placed or performed during the hospital encounter of 02/26/22 (from the past 48 hour(s))  Comprehensive metabolic panel     Status: Abnormal   Collection Time: 02/26/22  3:13 PM  Result Value Ref Range   Sodium 137 135 - 145 mmol/L   Potassium 3.5 3.5 - 5.1 mmol/L   Chloride 106 98 - 111 mmol/L   CO2 21 (L) 22 - 32 mmol/L   Glucose, Bld 148 (H) 70 - 99 mg/dL    Comment: Glucose reference range applies only to samples taken after fasting for at  least 8 hours.   BUN 11 8 - 23 mg/dL   Creatinine, Ser 9.82 0.61 - 1.24 mg/dL   Calcium 9.3 8.9 - 64.1 mg/dL   Total Protein 7.5 6.5 - 8.1 g/dL   Albumin 4.0 3.5 - 5.0 g/dL   AST 18 15 - 41 U/L   ALT 11 0 - 44 U/L   Alkaline Phosphatase 72 38 - 126 U/L   Total Bilirubin 0.8 0.3 - 1.2 mg/dL   GFR, Estimated >58 >30 mL/min    Comment: (NOTE) Calculated using the CKD-EPI Creatinine Equation (2021)    Anion gap 10 5 - 15    Comment: Performed at [facility], [address], [place], [place] [address]  Ethanol     Status: Abnormal   Collection Time: 02/26/22  3:13 PM  Result Value Ref Range   Alcohol, Ethyl (B) 54 (H) <10 mg/dL    Comment: (NOTE) Lowest detectable limit for serum alcohol is 10 mg/dL.  For medical purposes only. Performed at [facility], [address] [place], [place] [address]   Salicylate level     Status: Abnormal   Collection Time: 02/26/22  3:13 PM  Result Value Ref Range   Salicylate Lvl <7.0 (L) 7.0 - 30.0 mg/dL    Comment: Performed at [facility], [address] [place], [place] [address]  Acetaminophen level     Status: Abnormal   Collection Time: 02/26/22  3:13 PM  Result Value Ref Range   Acetaminophen (Tylenol), Serum <10 (L) 10 - 30 ug/mL    Comment: (NOTE) Therapeutic concentrations vary significantly. A range of 10-30 ug/mL  may be an effective concentration for many patients. However, some  are best treated at concentrations outside of this range. Acetaminophen concentrations >150 ug/mL at 4 hours after ingestion  and >50 ug/mL at 12 hours after ingestion are often associated with  toxic reactions.  Performed at [facility], [address] [place], [place] [address]   cbc     Status: Abnormal   Collection Time: 02/26/22  3:13 PM  Result Value Ref Range   WBC 10.7 (H) 4.0 - 10.5 K/uL   RBC 5.10 4.22 - 5.81 MIL/uL   Hemoglobin 14.8 13.0 - 17.0 g/dL   HCT 92.9 24.4 - 62.8 %   MCV 90.0 80.0 -  100.0 fL   MCH 29.0 26.0 - 34.0 pg   MCHC 32.2 30.0 - 36.0 g/dL   RDW 63.8 17.7 - 11.6 %  Platelets 365 150 - 400 K/uL   nRBC 0.0 0.0 - 0.2 %    Comment: Performed at [facility], [address] [place], [place] [address]  Urine Drug Screen, Qualitative     Status: Abnormal   Collection Time: 02/26/22  3:13 PM  Result Value Ref Range   Tricyclic, Ur Screen NONE DETECTED NONE DETECTED   Amphetamines, Ur Screen NONE DETECTED NONE DETECTED   MDMA (Ecstasy)Ur Screen NONE DETECTED NONE DETECTED   Cocaine Metabolite,Ur [place] POSITIVE (A) NONE DETECTED   Opiate, Ur Screen NONE DETECTED NONE DETECTED   Phencyclidine (PCP) Ur S NONE DETECTED NONE DETECTED   Cannabinoid 50 Ng, Ur [place] NONE DETECTED NONE DETECTED   Barbiturates, Ur Screen NONE DETECTED NONE DETECTED   Benzodiazepine, Ur Scrn NONE DETECTED NONE DETECTED   Methadone Scn, Ur NONE DETECTED NONE DETECTED    Comment: (NOTE) Tricyclics + metabolites, urine    Cutoff 1000 ng/mL Amphetamines + metabolites, urine  Cutoff 1000 ng/mL MDMA (Ecstasy), urine              Cutoff 500 ng/mL Cocaine Metabolite, urine          Cutoff 300 ng/mL Opiate + metabolites, urine        Cutoff 300 ng/mL Phencyclidine (PCP), urine         Cutoff 25 ng/mL Cannabinoid, urine                 Cutoff 50 ng/mL Barbiturates + metabolites, urine  Cutoff 200 ng/mL Benzodiazepine, urine              Cutoff 200 ng/mL Methadone, urine                   Cutoff 300 ng/mL  The urine drug screen provides only a preliminary, unconfirmed analytical test result and should not be used for non-medical purposes. Clinical consideration and professional judgment should be applied to any positive drug screen result due to possible interfering substances. A more specific alternate chemical method must be used in order to obtain a confirmed analytical result. Gas chromatography / mass spectrometry (GC/MS) is the preferred confirm atory method. Performed at [facility], [address] [place], [place] [address]     Current Facility-Administered Medications  Medication Dose Route Frequency Provider Last Rate Last Admin   hydrOXYzine (ATARAX) tablet 25 mg  25 mg Oral Q6H PRN Gabriel Cirri F, NP       loperamide (IMODIUM) capsule 2-4 mg  2-4 mg Oral PRN Vanetta Mulders, NP       LORazepam (ATIVAN) tablet 1 mg  1 mg Oral Q6H PRN Vanetta Mulders, NP       multivitamin with minerals tablet 1 tablet  1 tablet Oral Daily Chiamaka Latka F, NP       ondansetron (ZOFRAN-ODT) disintegrating tablet 4 mg  4 mg Oral Q6H PRN Gabriel Cirri F, NP       thiamine (VITAMIN B1) injection 100 mg  100 mg Intramuscular Once Vanetta Mulders, NP       [START ON 02/27/2022] thiamine (VITAMIN B1) tablet 100 mg  100 mg Oral Daily Vanetta Mulders, NP       No current outpatient medications on file.    Musculoskeletal: Strength & Muscle Tone: within normal limits Gait & Station: normal Patient leans: N/A            Psychiatric Specialty Exam:  Presentation  General Appearance:  Disheveled  Eye  Contact: Fleeting  Speech: Clear and Coherent  Speech Volume: Normal  Handedness: Right   Mood and Affect  Mood: Depressed  Affect: Blunt   Thought Process  Thought Processes: Coherent  Descriptions of Associations:Intact  Orientation:Full (Time, Place and Person)  Thought Content:WDL  History of Schizophrenia/Schizoaffective disorder:No  Duration of Psychotic Symptoms:No data recorded Hallucinations:Hallucinations: None  Ideas of Reference:None  Suicidal Thoughts:Suicidal Thoughts: No  Homicidal Thoughts:Homicidal Thoughts: No   Sensorium  Memory: Immediate Good  Judgment: Poor  Insight: Poor   Executive Functions  Concentration: Fair  Attention Span: Fair  Recall: Fair  Fund of Knowledge: Fair  Language: Fair   Psychomotor Activity  Psychomotor Activity:Psychomotor Activity:  Normal   Assets  Assets: Desire for Improvement; Financial Resources/Insurance; Housing; Resilience; Social Support   Sleep  Sleep:Sleep: Poor   Physical Exam: Physical Exam Vitals and nursing note reviewed.  HENT:     Head: Normocephalic.     Nose: No congestion or rhinorrhea.  Eyes:     General:        Right eye: No discharge.        Left eye: No discharge.  Cardiovascular:     Rate and Rhythm: Normal rate.  Pulmonary:     Effort: Pulmonary effort is normal.  Musculoskeletal:        General: Normal range of motion.     Cervical back: Normal range of motion.  Skin:    General: Skin is dry.  Neurological:     Mental Status: He is alert and oriented to person, place, and time.  Psychiatric:        Attention and Perception: Attention normal.        Mood and Affect: Mood is depressed. Affect is blunt.        Speech: Speech normal.        Behavior: Behavior is cooperative.        Thought Content: Thought content includes suicidal ideation.        Cognition and Memory: Cognition normal.        Judgment: Judgment is impulsive.    Review of Systems  Constitutional: Negative.   Respiratory: Negative.    Musculoskeletal: Negative.   Skin: Negative.   Psychiatric/Behavioral:  Positive for depression, substance abuse and suicidal ideas. Negative for hallucinations and memory loss. The patient has insomnia. The patient is not nervous/anxious.    Blood pressure (!) 146/80, pulse 71, temperature 98.3 F (36.8 C), temperature source Oral, resp. rate 18, SpO2 96 %. There is no height or weight on file to calculate BMI.  Treatment Plan Summary: Daily contact with patient to assess and evaluate symptoms and progress in treatment, Medication management, and Plan refer for inpatient psychiatric hospitalization.  Start CIWA protocol. Reviewed with Dr. Darnelle Catalan   Disposition: Recommend psychiatric Inpatient admission when medically cleared.  Vanetta Mulders, NP 02/26/2022 5:05  PM

## 2022-02-26 NOTE — BH Assessment (Signed)
Covid results faxed to [facility].

## 2022-02-26 NOTE — ED Notes (Signed)
Dinner tray placed at bedside. Pt asleep. Chest rise and fall noted.

## 2022-02-27 DIAGNOSIS — F1210 Cannabis abuse, uncomplicated: Secondary | ICD-10-CM | POA: Diagnosis not present

## 2022-02-27 MED ORDER — LORAZEPAM 2 MG PO TABS
2.0000 mg | ORAL_TABLET | Freq: Once | ORAL | Status: AC
  Administered 2022-02-27: 2 mg via ORAL
  Filled 2022-02-27: qty 1

## 2022-02-27 MED ORDER — CLONIDINE HCL 0.1 MG PO TABS
0.2000 mg | ORAL_TABLET | Freq: Once | ORAL | Status: AC
  Administered 2022-02-27: 0.2 mg via ORAL
  Filled 2022-02-27: qty 2

## 2022-02-27 NOTE — ED Notes (Signed)
EDP, Modesto Charon notified of most most recent CIWA score and elevated BP.

## 2022-02-27 NOTE — ED Notes (Signed)
Transferred to [facility] via [employer]. Sent with 1 bag of belongings and appropriate paperwork. Pt alert and oriented X4, cooperative, RR even and unlabored, color WNL. Pt in NAD. ambulatory

## 2022-02-27 NOTE — ED Notes (Signed)
Patient was sleeping while staff bringing in breakfast in. Staff tried to wake up patient but he would not wake up. Breakfast is sitting near patient for when he wakes up.

## 2022-02-27 NOTE — ED Provider Notes (Signed)
Patient states that he feels symptoms of withdrawal from heroin.  He does not look like he is in alcohol/benzodiazepine withdrawal, no tremors or tongue fasciculations he is mildly hypertensive and explains that he has symptoms more consistent with heroin withdrawal given abdominal cramping, anxiety, has hypertension.  We will give Ativan, clonidine, Zofran.  His last alcohol use was greater than 24 hours ago.   Pilar Jarvis, MD 02/27/22 [phone]

## 2022-02-27 NOTE — ED Notes (Signed)
Report to Lyla Son, Charity fundraiser at [facility]

## 2022-02-27 NOTE — ED Notes (Signed)
Attempt to call report to [phone], left HIPAA compliant VM for return phone call.

## 2022-02-27 NOTE — ED Provider Notes (Signed)
Emergency Medicine Observation Re-evaluation Note  Jorge Maldonado is a 66 y.o. male, seen on rounds today.  Pt initially presented to the ED for complaints of Suicidal Currently, the patient is resting, voices no medical complaints.  Physical Exam  BP (!) 153/98 (BP Location: Left Arm)   Pulse 76   Temp 97.8 F (36.6 C) (Oral)   Resp 18   SpO2 98%  Physical Exam General: Resting in no acute distress Cardiac: No cyanosis Lungs: Equal rise and fall Psych: Not agitated  ED Course / MDM  EKG:   I have reviewed the labs performed to date as well as medications administered while in observation.  Recent changes in the last 24 hours include no events overnight.  Plan  Current plan is for psychiatric disposition.    Irean Hong, MD 02/27/22 0700

## 2022-02-27 NOTE — ED Notes (Signed)
Therapist, nutritional and consent for transfer signed on paper and placed on chart.

## 2022-02-27 NOTE — ED Notes (Signed)
Safe  transport  called  to  transport  pt  to  [facility]

## 2022-04-27 ENCOUNTER — Other Ambulatory Visit: Payer: Self-pay

## 2022-04-27 ENCOUNTER — Emergency Department
Admission: EM | Admit: 2022-04-27 | Discharge: 2022-04-27 | Disposition: A | Discharge status: Home / Self Care | Payer: Medicare Other | Attending: Emergency Medicine | Admitting: Emergency Medicine

## 2022-04-27 ENCOUNTER — Encounter: Payer: Self-pay | Admitting: Emergency Medicine

## 2022-04-27 DIAGNOSIS — R4182 Altered mental status, unspecified: Secondary | ICD-10-CM | POA: Insufficient documentation

## 2022-04-27 DIAGNOSIS — I10 Essential (primary) hypertension: Secondary | ICD-10-CM | POA: Insufficient documentation

## 2022-04-27 DIAGNOSIS — Y902 Blood alcohol level of 40-59 mg/100 ml: Secondary | ICD-10-CM | POA: Insufficient documentation

## 2022-04-27 DIAGNOSIS — F10920 Alcohol use, unspecified with intoxication, uncomplicated: Secondary | ICD-10-CM | POA: Diagnosis present

## 2022-04-27 LAB — SALICYLATE LEVEL: Salicylate Lvl: <7 mg/dL — ABNORMAL LOW (ref 7.0–30.0)

## 2022-04-27 LAB — CBC
HCT: 45 % (ref 39.0–52.0)
Hemoglobin: 14.6 g/dL (ref 13.0–17.0)
MCH: 28.7 pg (ref 26.0–34.0)
MCHC: 32.4 g/dL (ref 30.0–36.0)
MCV: 88.6 fL (ref 80.0–100.0)
Platelets: 425 10*3/uL — ABNORMAL HIGH (ref 150–400)
RBC: 5.08 MIL/uL (ref 4.22–5.81)
RDW: 15.1 % (ref 11.5–15.5)
WBC: 10 10*3/uL (ref 4.0–10.5)
nRBC: 0 % (ref 0.0–0.2)

## 2022-04-27 LAB — COMPREHENSIVE METABOLIC PANEL
ALT: 12 U/L (ref 0–44)
AST: 19 U/L (ref 15–41)
Albumin: 4.3 g/dL (ref 3.5–5.0)
Alkaline Phosphatase: 65 U/L (ref 38–126)
Anion gap: 10 (ref 5–15)
BUN: 14 mg/dL (ref 8–23)
CO2: 25 mmol/L (ref 22–32)
Calcium: 9.2 mg/dL (ref 8.9–10.3)
Chloride: 105 mmol/L (ref 98–111)
Creatinine, Ser: 0.87 mg/dL (ref 0.61–1.24)
GFR, Estimated: >60 mL/min (ref >60)
Glucose, Bld: 118 mg/dL — ABNORMAL HIGH (ref 70–99)
Potassium: 3.6 mmol/L (ref 3.5–5.1)
Sodium: 140 mmol/L (ref 135–145)
Total Bilirubin: 1.2 mg/dL (ref 0.3–1.2)
Total Protein: 7.8 g/dL (ref 6.5–8.1)

## 2022-04-27 LAB — ETHANOL: Alcohol, Ethyl (B): 43 mg/dL — ABNORMAL HIGH (ref <10)

## 2022-04-27 LAB — ACETAMINOPHEN LEVEL: Acetaminophen (Tylenol), Serum: <10 ug/mL — ABNORMAL LOW (ref 10–30)

## 2022-04-27 NOTE — ED Notes (Signed)
Pt advised to visit front desk security to obtain personal knife confiscated upon arrival.

## 2022-04-27 NOTE — ED Notes (Signed)
Prior to walking out of lobby pt was asked if his driver was here and pt states, "Yes - thank you." Pt walked out of ED lobby - steady gait at this time.

## 2022-04-27 NOTE — ED Notes (Signed)
Pt stating that he is going to leave if he does not get discharge papers now.  Ride waiting in parkiing lot, verified by triage staff.  Pt agreed to sign out AMA and states understanding of form.  EDP Jacelyn Grip made aware verbally.

## 2022-04-27 NOTE — ED Triage Notes (Signed)
Pt via [facility] from the side of the road. Pt was slumped over in the fetal position on the side of the road. Pt admits to drinking bootleggers since last night. Pt has a hx of heroin and cocaine abuse but states she has not take anything recently. States he last used fentanyl and suboxone 3 days ago. Pt is crying during triage but calm and cooperative.    Pt's 2 orange lighters and 1 silver pocket knife locked in the lobby safe by security at this time.

## 2022-04-27 NOTE — ED Provider Notes (Addendum)
[facility] Provider Note    Event Date/Time   First MD Initiated Contact with Patient 04/27/22 1547     (approximate)   History   Alcohol Intoxication   HPI  Jorge Maldonado is a 67 y.o. male   Past medical history of hypertension, polysubstance use, presents to the emergency department with alcohol intoxication found by police on the side of the road hunched over stating that he drinks significant amount of alcohol.  No other drug use.  No other acute medical complaints.  Independent Historian contributed to assessment above: EMS personnel  External Medical Documents Reviewed: December 2023 emergency department notes for psychiatric assessment      Physical Exam   Triage Vital Signs: ED Triage Vitals [04/27/22 1550]  Enc Vitals Group     BP 136/69     Pulse Rate 80     Resp (!) 22     Temp 97.8 F (36.6 C)     Temp src      SpO2 99 %     Weight 175 lb (79.4 kg)     Height 6\' 1"  (1.854 m)     Head Circumference      Peak Flow      Pain Score 6     Pain Loc      Pain Edu?      Excl. in [place]?     Most recent vital signs: Vitals:   04/27/22 1550 04/27/22 1926  BP: 136/69 (!) 165/102  Pulse: 80 82  Resp: (!) 22 18  Temp: 97.8 F (36.6 C) 97.7 F (36.5 C)  SpO2: 99% 100%    General: Awake, no distress.  CV:  Good peripheral perfusion.  Resp:  Normal effort.  Abd:  No distention.  Other:  Sleeping comfortably respirations even and unlabored, no obvious signs of trauma, lungs clear abdomen soft nontender skin appears euvolemic vital signs reviewed within normal limits   ED Results / Procedures / Treatments   Labs (all labs ordered are listed, but only abnormal results are displayed) Labs Reviewed  COMPREHENSIVE METABOLIC PANEL - Abnormal; Notable for the following components:      Result Value   Glucose, Bld 118 (*)    All other components within normal limits  ETHANOL - Abnormal; Notable for the following components:    Alcohol, Ethyl (B) 43 (*)    All other components within normal limits  CBC - Abnormal; Notable for the following components:   Platelets 425 (*)    All other components within normal limits  ACETAMINOPHEN LEVEL - Abnormal; Notable for the following components:   Acetaminophen (Tylenol), Serum <10 (*)    All other components within normal limits  SALICYLATE LEVEL - Abnormal; Notable for the following components:   Salicylate Lvl <0.3 (*)    All other components within normal limits  URINE DRUG SCREEN, QUALITATIVE ([facility] ONLY)     I ordered and reviewed the above labs they are notable for alcohol level is 43 and he has a negative Tylenol and salicylate level.    PROCEDURES:  Critical Care performed: No  Procedures   MEDICATIONS ORDERED IN ED: Medications - No data to display   IMPRESSION / MDM / [place] / ED COURSE  I reviewed the triage vital signs and the nursing notes.  Patient's presentation is most consistent with acute presentation with potential threat to life or bodily function.  Differential diagnosis includes, but is not limited to, alcohol intoxication, other toxidrome or ingestions, metabolic derangement, considered but less likely traumatic injuries    MDM: With alcohol use appears intoxicated no other acute medical complaints no other signs of injury normal vital signs will check basic labs, toxicology labs, urine drug screen and allow for sobriety for reassessment.  Pt awoke while I was seeing another critical patient and was demanding to leave, his pastor was here for pickup and he reportedly had no further complaints to our nursing and was ambulatory with steady gait, but since I was unable to meet with him prior to discharge to review and reassess, he was signed out AMA.      FINAL CLINICAL IMPRESSION(S) / ED DIAGNOSES   Final diagnoses:  Alcoholic intoxication without complication ([place])     Rx / DC  Orders   ED Discharge Orders     None        Note:  This document was prepared using Dragon voice recognition software and may include unintentional dictation errors.    Lucillie Garfinkel, MD 04/27/22 Arville Lime    Lucillie Garfinkel, MD 04/27/22 [phone]

## 2022-09-13 ENCOUNTER — Observation Stay
Admission: EM | Admit: 2022-09-13 | Discharge: 2022-09-15 | Disposition: A | Discharge status: Home / Self Care | Payer: Medicare Other | Attending: Surgery | Admitting: Surgery

## 2022-09-13 ENCOUNTER — Emergency Department: Payer: Medicare Other

## 2022-09-13 ENCOUNTER — Emergency Department: Payer: Medicare Other | Admitting: Registered Nurse

## 2022-09-13 ENCOUNTER — Encounter
Admission: EM | Disposition: A | Discharge status: Home / Self Care | Payer: Self-pay | Source: Home / Self Care | Attending: Emergency Medicine

## 2022-09-13 DIAGNOSIS — Y92410 Unspecified street and highway as the place of occurrence of the external cause: Secondary | ICD-10-CM | POA: Diagnosis not present

## 2022-09-13 DIAGNOSIS — Z23 Encounter for immunization: Secondary | ICD-10-CM | POA: Insufficient documentation

## 2022-09-13 DIAGNOSIS — S1193XA Puncture wound without foreign body of unspecified part of neck, initial encounter: Principal | ICD-10-CM | POA: Insufficient documentation

## 2022-09-13 DIAGNOSIS — I10 Essential (primary) hypertension: Secondary | ICD-10-CM | POA: Insufficient documentation

## 2022-09-13 DIAGNOSIS — S1980XA Other specified injuries of unspecified part of neck, initial encounter: Secondary | ICD-10-CM | POA: Diagnosis not present

## 2022-09-13 DIAGNOSIS — Y9355 Activity, bike riding: Secondary | ICD-10-CM | POA: Diagnosis not present

## 2022-09-13 DIAGNOSIS — S199XXA Unspecified injury of neck, initial encounter: Secondary | ICD-10-CM | POA: Diagnosis present

## 2022-09-13 DIAGNOSIS — F17210 Nicotine dependence, cigarettes, uncomplicated: Secondary | ICD-10-CM | POA: Diagnosis not present

## 2022-09-13 HISTORY — PX: INCISION AND DRAINAGE: SHX5863

## 2022-09-13 LAB — TYPE AND SCREEN

## 2022-09-13 LAB — CBC
HCT: 37.9 % — ABNORMAL LOW (ref 39.0–52.0)
Hemoglobin: 12.4 g/dL — ABNORMAL LOW (ref 13.0–17.0)
MCH: 29.2 pg (ref 26.0–34.0)
MCHC: 32.7 g/dL (ref 30.0–36.0)
MCV: 89.4 fL (ref 80.0–100.0)
Platelets: 263 10*3/uL (ref 150–400)
RBC: 4.24 MIL/uL (ref 4.22–5.81)
RDW: 14.1 % (ref 11.5–15.5)
WBC: 12.4 10*3/uL — ABNORMAL HIGH (ref 4.0–10.5)
nRBC: 0 % (ref 0.0–0.2)

## 2022-09-13 LAB — BASIC METABOLIC PANEL
Anion gap: 10 (ref 5–15)
BUN: 20 mg/dL (ref 8–23)
CO2: 24 mmol/L (ref 22–32)
Calcium: 8.5 mg/dL — ABNORMAL LOW (ref 8.9–10.3)
Chloride: 103 mmol/L (ref 98–111)
Creatinine, Ser: 0.82 mg/dL (ref 0.61–1.24)
GFR, Estimated: >60 mL/min (ref >60)
Glucose, Bld: 114 mg/dL — ABNORMAL HIGH (ref 70–99)
Potassium: 3.4 mmol/L — ABNORMAL LOW (ref 3.5–5.1)
Sodium: 137 mmol/L (ref 135–145)

## 2022-09-13 LAB — PROTIME-INR
INR: 1.2 (ref 0.8–1.2)
Prothrombin Time: 15.5 seconds — ABNORMAL HIGH (ref 11.4–15.2)

## 2022-09-13 SURGERY — INCISION AND DRAINAGE
Anesthesia: General | Laterality: Left

## 2022-09-13 MED ORDER — ROCURONIUM BROMIDE 100 MG/10ML IV SOLN
INTRAVENOUS | Status: DC | PRN
  Administered 2022-09-13: 30 mg via INTRAVENOUS

## 2022-09-13 MED ORDER — LACTATED RINGERS IV SOLN: INTRAVENOUS | Status: DC | PRN

## 2022-09-13 MED ORDER — TETANUS-DIPHTH-ACELL PERTUSSIS 5-2.5-18.5 LF-MCG/0.5 IM SUSY
0.5000 mL | PREFILLED_SYRINGE | Freq: Once | INTRAMUSCULAR | Status: AC
  Administered 2022-09-13: 0.5 mL via INTRAMUSCULAR
  Filled 2022-09-13: qty 0.5

## 2022-09-13 MED ORDER — IOHEXOL 350 MG/ML SOLN
75.0000 mL | Freq: Once | INTRAVENOUS | Status: AC | PRN
  Administered 2022-09-13: 75 mL via INTRAVENOUS

## 2022-09-13 MED ORDER — SUCCINYLCHOLINE CHLORIDE 200 MG/10ML IV SOSY
PREFILLED_SYRINGE | INTRAVENOUS | Status: DC | PRN
  Administered 2022-09-13: 140 mg via INTRAVENOUS

## 2022-09-13 MED ORDER — FENTANYL CITRATE (PF) 100 MCG/2ML IJ SOLN
INTRAMUSCULAR | Status: DC | PRN
  Administered 2022-09-13 – 2022-09-14 (×2): 50 ug via INTRAVENOUS

## 2022-09-13 MED ORDER — FENTANYL CITRATE (PF) 100 MCG/2ML IJ SOLN
INTRAMUSCULAR | Status: AC
  Filled 2022-09-13: qty 2

## 2022-09-13 MED ORDER — MIDAZOLAM HCL 2 MG/2ML IJ SOLN
INTRAMUSCULAR | Status: AC
  Filled 2022-09-13: qty 2

## 2022-09-13 MED ORDER — PROPOFOL 10 MG/ML IV BOLUS
INTRAVENOUS | Status: DC | PRN
  Administered 2022-09-13: 160 mg via INTRAVENOUS

## 2022-09-13 MED ORDER — CEFAZOLIN SODIUM-DEXTROSE 1-4 GM/50ML-% IV SOLN
1.0000 g | Freq: Once | INTRAVENOUS | Status: AC
  Administered 2022-09-13: 1 g via INTRAVENOUS
  Filled 2022-09-13: qty 50

## 2022-09-13 MED ORDER — FENTANYL CITRATE PF 50 MCG/ML IJ SOSY
25.0000 ug | PREFILLED_SYRINGE | Freq: Once | INTRAMUSCULAR | Status: AC
  Administered 2022-09-13: 25 ug via INTRAVENOUS
  Filled 2022-09-13: qty 1

## 2022-09-13 MED ORDER — DEXAMETHASONE SODIUM PHOSPHATE 10 MG/ML IJ SOLN
INTRAMUSCULAR | Status: DC | PRN
  Administered 2022-09-13: 10 mg via INTRAVENOUS

## 2022-09-13 SURGICAL SUPPLY — 21 items
AGENT HMST KT MTR STRL THRMB (HEMOSTASIS) ×1
DRAPE LAPAROTOMY 77X122 PED (DRAPES) ×2 IMPLANT
ELECT REM PT RETURN 9FT ADLT (ELECTROSURGICAL) ×1
ELECTRODE REM PT RTRN 9FT ADLT (ELECTROSURGICAL) ×2 IMPLANT
GLOVE BIO SURGEON STRL SZ7 (GLOVE) ×4 IMPLANT
GOWN STRL REUS W/ TWL LRG LVL3 (GOWN DISPOSABLE) ×6 IMPLANT
GOWN STRL REUS W/ TWL XL LVL3 (GOWN DISPOSABLE) ×4 IMPLANT
GOWN STRL REUS W/TWL LRG LVL3 (GOWN DISPOSABLE) ×3
GOWN STRL REUS W/TWL XL LVL3 (GOWN DISPOSABLE) ×2
KIT TURNOVER KIT A (KITS) ×2 IMPLANT
MANIFOLD NEPTUNE II (INSTRUMENTS) ×2 IMPLANT
PACK BASIN MAJOR ARMC (MISCELLANEOUS) ×2 IMPLANT
STAPLER SKIN PROX 35W (STAPLE) IMPLANT
SURGIFLO W/THROMBIN 8M KIT (HEMOSTASIS) IMPLANT
SUT MNCRL 4-0 (SUTURE) ×1
SUT MNCRL 4-0 27XMFL (SUTURE) ×1
SUT SILK 2 0 SH (SUTURE) IMPLANT
SUTURE MNCRL 4-0 27XMF (SUTURE) ×2 IMPLANT
TRAP FLUID SMOKE EVACUATOR (MISCELLANEOUS) ×2 IMPLANT
TUBING CONNECTING 10 (TUBING) IMPLANT
WATER STERILE IRR 500ML POUR (IV SOLUTION) ×2 IMPLANT
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
## 2022-09-13 NOTE — ED Notes (Signed)
Report given to OR charge RN

## 2022-09-14 ENCOUNTER — Encounter: Payer: Self-pay | Admitting: Surgery

## 2022-09-14 DIAGNOSIS — S1193XA Puncture wound without foreign body of unspecified part of neck, initial encounter: Secondary | ICD-10-CM | POA: Diagnosis not present

## 2022-09-14 DIAGNOSIS — S1980XA Other specified injuries of unspecified part of neck, initial encounter: Secondary | ICD-10-CM

## 2022-09-14 DIAGNOSIS — S199XXA Unspecified injury of neck, initial encounter: Secondary | ICD-10-CM | POA: Diagnosis present

## 2022-09-14 LAB — COMPREHENSIVE METABOLIC PANEL
ALT: 19 U/L (ref 0–44)
AST: 27 U/L (ref 15–41)
Albumin: 3.7 g/dL (ref 3.5–5.0)
Alkaline Phosphatase: 66 U/L (ref 38–126)
Anion gap: 7 (ref 5–15)
BUN: 17 mg/dL (ref 8–23)
CO2: 25 mmol/L (ref 22–32)
Calcium: 8.5 mg/dL — ABNORMAL LOW (ref 8.9–10.3)
Chloride: 105 mmol/L (ref 98–111)
Creatinine, Ser: 0.67 mg/dL (ref 0.61–1.24)
GFR, Estimated: >60 mL/min (ref >60)
Glucose, Bld: 150 mg/dL — ABNORMAL HIGH (ref 70–99)
Potassium: 4.1 mmol/L (ref 3.5–5.1)
Sodium: 137 mmol/L (ref 135–145)
Total Bilirubin: 1.1 mg/dL (ref 0.3–1.2)
Total Protein: 6.7 g/dL (ref 6.5–8.1)

## 2022-09-14 LAB — CBC
HCT: 37.7 % — ABNORMAL LOW (ref 39.0–52.0)
Hemoglobin: 12.3 g/dL — ABNORMAL LOW (ref 13.0–17.0)
MCH: 28.9 pg (ref 26.0–34.0)
MCHC: 32.6 g/dL (ref 30.0–36.0)
MCV: 88.7 fL (ref 80.0–100.0)
Platelets: 249 10*3/uL (ref 150–400)
RBC: 4.25 MIL/uL (ref 4.22–5.81)
RDW: 14.1 % (ref 11.5–15.5)
WBC: 10.7 10*3/uL — ABNORMAL HIGH (ref 4.0–10.5)
nRBC: 0 % (ref 0.0–0.2)

## 2022-09-14 LAB — HIV ANTIBODY (ROUTINE TESTING W REFLEX): HIV Screen 4th Generation wRfx: NONREACTIVE

## 2022-09-14 MED ORDER — SODIUM CHLORIDE 0.9 % IV SOLN: INTRAVENOUS | Status: DC

## 2022-09-14 MED ORDER — EPHEDRINE SULFATE (PRESSORS) 50 MG/ML IJ SOLN
INTRAMUSCULAR | Status: DC | PRN
  Administered 2022-09-13: 10 mg via INTRAVENOUS
  Administered 2022-09-13: 15 mg via INTRAVENOUS
  Administered 2022-09-14: 5 mg via INTRAVENOUS
  Administered 2022-09-14 (×2): 10 mg via INTRAVENOUS

## 2022-09-14 MED ORDER — ACETAMINOPHEN 10 MG/ML IV SOLN
INTRAVENOUS | Status: DC | PRN
  Administered 2022-09-14: 1000 mg via INTRAVENOUS

## 2022-09-14 MED ORDER — ALUM & MAG HYDROXIDE-SIMETH 200-200-20 MG/5ML PO SUSP: 15.0000 mL | ORAL | Status: DC | PRN

## 2022-09-14 MED ORDER — METOPROLOL TARTRATE 5 MG/5ML IV SOLN: 2.0000 mg | INTRAVENOUS | Status: DC | PRN

## 2022-09-14 MED ORDER — OXYCODONE HCL 5 MG PO TABS
ORAL_TABLET | ORAL | Status: AC
  Filled 2022-09-14: qty 1

## 2022-09-14 MED ORDER — OXYCODONE HCL 5 MG/5ML PO SOLN: 5.0000 mg | Freq: Once | ORAL | Status: AC | PRN

## 2022-09-14 MED ORDER — ACETAMINOPHEN 10 MG/ML IV SOLN: 1000.0000 mg | Freq: Once | INTRAVENOUS | Status: DC | PRN

## 2022-09-14 MED ORDER — ONDANSETRON HCL 4 MG/2ML IJ SOLN: 4.0000 mg | Freq: Once | INTRAMUSCULAR | Status: DC | PRN

## 2022-09-14 MED ORDER — POTASSIUM CHLORIDE CRYS ER 20 MEQ PO TBCR: 20.0000 meq | EXTENDED_RELEASE_TABLET | Freq: Once | ORAL | Status: DC

## 2022-09-14 MED ORDER — MORPHINE SULFATE (PF) 2 MG/ML IV SOLN
2.0000 mg | INTRAVENOUS | Status: DC | PRN
  Administered 2022-09-14: 2 mg via INTRAVENOUS
  Filled 2022-09-14: qty 1

## 2022-09-14 MED ORDER — CEFAZOLIN SODIUM-DEXTROSE 2-3 GM-%(50ML) IV SOLR
INTRAVENOUS | Status: DC | PRN
  Administered 2022-09-14: 2 g via INTRAVENOUS

## 2022-09-14 MED ORDER — 0.9 % SODIUM CHLORIDE (POUR BTL) OPTIME
TOPICAL | Status: DC | PRN
  Administered 2022-09-14: 1000 mL

## 2022-09-14 MED ORDER — PANTOPRAZOLE SODIUM 40 MG PO TBEC
40.0000 mg | DELAYED_RELEASE_TABLET | Freq: Every day | ORAL | Status: DC
  Administered 2022-09-14 – 2022-09-15 (×2): 40 mg via ORAL
  Filled 2022-09-14 (×2): qty 1

## 2022-09-14 MED ORDER — HYDRALAZINE HCL 20 MG/ML IJ SOLN: 5.0000 mg | INTRAMUSCULAR | Status: DC | PRN

## 2022-09-14 MED ORDER — ACETAMINOPHEN 10 MG/ML IV SOLN
INTRAVENOUS | Status: AC
  Filled 2022-09-14: qty 100

## 2022-09-14 MED ORDER — ACETAMINOPHEN 325 MG RE SUPP: 325.0000 mg | RECTAL | Status: DC | PRN

## 2022-09-14 MED ORDER — GUAIFENESIN-DM 100-10 MG/5ML PO SYRP: 15.0000 mL | ORAL_SOLUTION | ORAL | Status: DC | PRN

## 2022-09-14 MED ORDER — ONDANSETRON HCL 4 MG/2ML IJ SOLN
INTRAMUSCULAR | Status: DC | PRN
  Administered 2022-09-14: 4 mg via INTRAVENOUS

## 2022-09-14 MED ORDER — GLYCOPYRROLATE 0.2 MG/ML IJ SOLN
INTRAMUSCULAR | Status: DC | PRN
  Administered 2022-09-13: .2 mg via INTRAVENOUS

## 2022-09-14 MED ORDER — OXYCODONE HCL 5 MG PO TABS
5.0000 mg | ORAL_TABLET | ORAL | Status: DC | PRN
  Administered 2022-09-14 – 2022-09-15 (×5): 10 mg via ORAL
  Filled 2022-09-14 (×5): qty 2

## 2022-09-14 MED ORDER — ACETAMINOPHEN 325 MG PO TABS: 325.0000 mg | ORAL_TABLET | ORAL | Status: DC | PRN

## 2022-09-14 MED ORDER — PHENOL 1.4 % MT LIQD: 1.0000 | OROMUCOSAL | Status: DC | PRN

## 2022-09-14 MED ORDER — OXYCODONE HCL 5 MG PO TABS
5.0000 mg | ORAL_TABLET | Freq: Once | ORAL | Status: AC | PRN
  Administered 2022-09-14: 5 mg via ORAL

## 2022-09-14 MED ORDER — ONDANSETRON HCL 4 MG/2ML IJ SOLN: 4.0000 mg | Freq: Four times a day (QID) | INTRAMUSCULAR | Status: DC | PRN

## 2022-09-14 MED ORDER — FENTANYL CITRATE (PF) 100 MCG/2ML IJ SOLN
INTRAMUSCULAR | Status: AC
  Filled 2022-09-14: qty 2

## 2022-09-14 MED ORDER — LABETALOL HCL 5 MG/ML IV SOLN
INTRAVENOUS | Status: AC
  Filled 2022-09-14: qty 4

## 2022-09-14 MED ORDER — LIDOCAINE HCL (CARDIAC) PF 100 MG/5ML IV SOSY
PREFILLED_SYRINGE | INTRAVENOUS | Status: DC | PRN
  Administered 2022-09-13: 100 mg via INTRAVENOUS

## 2022-09-14 MED ORDER — FENTANYL CITRATE (PF) 100 MCG/2ML IJ SOLN
25.0000 ug | INTRAMUSCULAR | Status: DC | PRN
  Administered 2022-09-14: 50 ug via INTRAVENOUS

## 2022-09-14 MED ORDER — ORAL CARE MOUTH RINSE: 15.0000 mL | OROMUCOSAL | Status: DC | PRN

## 2022-09-14 MED ORDER — LABETALOL HCL 5 MG/ML IV SOLN
10.0000 mg | INTRAVENOUS | Status: DC | PRN
  Administered 2022-09-14 (×2): 10 mg via INTRAVENOUS
  Filled 2022-09-14 (×2): qty 4

## 2022-09-14 MED ORDER — CEFAZOLIN SODIUM-DEXTROSE 1-4 GM/50ML-% IV SOLN
1.0000 g | Freq: Three times a day (TID) | INTRAVENOUS | Status: AC
  Administered 2022-09-14 (×3): 1 g via INTRAVENOUS
  Filled 2022-09-14 (×3): qty 50

## 2022-09-14 NOTE — Transfer of Care (Signed)
Immediate Anesthesia Transfer of Care Note  Patient: Jorge Maldonado  Procedure(s) Performed: Procedure(s): I&D and ligation of vein (Left)  Patient Location: PACU  Anesthesia Type:General  Level of Consciousness: sedated  Airway & Oxygen Therapy: Patient Spontanous Breathing and Patient connected to face mask oxygen  Post-op Assessment: Report given to RN and Post -op Vital signs reviewed and stable  Post vital signs: Reviewed and stable  Last Vitals:  Vitals:   09/13/22 2056 09/14/22 0102  BP: 136/80 (!) 155/104  Pulse:  70  Resp: 16 13  Temp: 36.7 C (!) 36 C  SpO2: 98% 100%    Complications: No apparent anesthesia complications

## 2022-09-14 NOTE — Progress Notes (Signed)
Pt's sister, Gavin Pound listed in the facesheet, called and spoke to this RN and wants to know if pt can be admitted to Psych unit as she thinks pt needs it due to drug and ETOH abuse. MD was informed.

## 2022-09-14 NOTE — Anesthesia Postprocedure Evaluation (Signed)
Anesthesia Post Note  Patient: Jorge Maldonado  Procedure(s) Performed: I&D and ligation of vein (Left)  Patient location during evaluation: PACU Anesthesia Type: General Level of consciousness: awake and alert Pain management: pain level controlled Vital Signs Assessment: post-procedure vital signs reviewed and stable Respiratory status: spontaneous breathing, nonlabored ventilation, respiratory function stable and patient connected to nasal cannula oxygen Cardiovascular status: blood pressure returned to baseline and stable Postop Assessment: no apparent nausea or vomiting Anesthetic complications: no   No notable events documented.   Last Vitals:  Vitals:   09/14/22 0157 09/14/22 0200  BP:  (!) 148/92  Pulse: (!) 57 (!) 57  Resp: 13 11  Temp:  (!) 36.1 C  SpO2: 96% 96%    Last Pain:  Vitals:   09/14/22 0157  TempSrc:   PainSc: Asleep                 Corinda Gubler
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
## 2022-09-14 NOTE — Anesthesia Procedure Notes (Signed)
Procedure Name: Intubation Date/Time: 09/14/2022 12:06 AM  Performed by: Stormy Fabian, CRNAPre-anesthesia Checklist: Patient identified, Patient being monitored, Timeout performed, Emergency Drugs available and Suction available Patient Re-evaluated:Patient Re-evaluated prior to induction Oxygen Delivery Method: Circle system utilized Preoxygenation: Pre-oxygenation with 100% oxygen Induction Type: IV induction and Rapid sequence Laryngoscope Size: 3 and McGraph Grade View: Grade II Tube type: Oral Tube size: 7.0 mm Number of attempts: 1 Airway Equipment and Method: Stylet Placement Confirmation: ETT inserted through vocal cords under direct vision, positive ETCO2 and breath sounds checked- equal and bilateral Secured at: 21 cm Tube secured with: Tape Dental Injury: Teeth and Oropharynx as per pre-operative assessment

## 2022-09-15 DIAGNOSIS — S1193XA Puncture wound without foreign body of unspecified part of neck, initial encounter: Secondary | ICD-10-CM | POA: Diagnosis not present

## 2022-09-15 LAB — TYPE AND SCREEN
ABO/RH(D): A NEG
Antibody Screen: NEGATIVE

## 2022-09-15 MED ORDER — OXYCODONE-ACETAMINOPHEN 5-325 MG PO TABS: 1.0000 | ORAL_TABLET | ORAL | 0 refills | Status: DC | PRN
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
## 2022-09-15 NOTE — Progress Notes (Signed)
    Subjective  - POD #2  Ready to go home   Physical Exam:  Incision dressing removed, no issues       Assessment/Plan:  POD #2  D/c home  Jorge Maldonado 09/15/2022 12:33 PM --  Vitals:   09/15/22 0451 09/15/22 0734  BP: (!) 157/88 (!) 158/85  Pulse: (!) 52 (!) 50  Resp: 18 15  Temp: 98 F (36.7 C) 98.4 F (36.9 C)  SpO2: 100% 99%    Intake/Output Summary (Last 24 hours) at 09/15/2022 1233 Last data filed at 09/15/2022 1015 Gross per 24 hour  Intake 2487.57 ml  Output --  Net 2487.57 ml     Laboratory CBC    Component Value Date/Time   WBC 10.7 (H) 09/14/2022 0400   HGB 12.3 (L) 09/14/2022 0400   HGB 13.8 10/31/2011 0551   HCT 37.7 (L) 09/14/2022 0400   HCT 40.3 10/31/2011 0551   PLT 249 09/14/2022 0400   PLT 211 10/31/2011 0551    BMET    Component Value Date/Time   NA 137 09/14/2022 0400   NA 142 10/31/2011 0551   K 4.1 09/14/2022 0400   K 3.6 10/31/2011 0551   CL 105 09/14/2022 0400   CL 111 (H) 10/31/2011 0551   CO2 25 09/14/2022 0400   CO2 24 10/31/2011 0551   GLUCOSE 150 (H) 09/14/2022 0400   GLUCOSE 125 (H) 10/31/2011 0551   BUN 17 09/14/2022 0400   BUN 7 10/31/2011 0551   CREATININE 0.67 09/14/2022 0400   CREATININE 0.93 10/31/2011 0551   CALCIUM 8.5 (L) 09/14/2022 0400   CALCIUM 7.9 (L) 10/31/2011 0551   GFRNONAA >60 09/14/2022 0400   GFRNONAA >60 10/31/2011 0551   GFRAA >60 10/31/2011 0551    COAG Lab Results  Component Value Date   INR 1.2 09/13/2022   No results found for: "PTT"  Antibiotics Anti-infectives (From admission, onward)    Start     Dose/Rate Route Frequency Ordered Stop   09/14/22 0800  ceFAZolin (ANCEF) IVPB 1 g/50 mL premix        1 g 100 mL/hr over 30 Minutes Intravenous Every 8 hours 09/14/22 0053 09/15/22 0726   09/13/22 2100  ceFAZolin (ANCEF) IVPB 1 g/50 mL premix        1 g 100 mL/hr over 30 Minutes Intravenous  Once 09/13/22 2056 09/13/22 2208        V. Charlena Cross, M.D.,  [facility] [place] Office: [phone] Pager:  [phone]

## 2022-10-04 ENCOUNTER — Ambulatory Visit (INDEPENDENT_AMBULATORY_CARE_PROVIDER_SITE_OTHER): Payer: Medicare Other | Admitting: Nurse Practitioner

## 2022-10-06 ENCOUNTER — Emergency Department
Admission: EM | Admit: 2022-10-06 | Discharge: 2022-10-06 | Disposition: A | Discharge status: Home / Self Care | Payer: Medicare Other | Attending: Emergency Medicine | Admitting: Emergency Medicine

## 2022-10-06 ENCOUNTER — Emergency Department: Payer: Medicare Other

## 2022-10-06 ENCOUNTER — Encounter: Payer: Self-pay | Admitting: Emergency Medicine

## 2022-10-06 ENCOUNTER — Other Ambulatory Visit: Payer: Self-pay

## 2022-10-06 DIAGNOSIS — S20211A Contusion of right front wall of thorax, initial encounter: Secondary | ICD-10-CM | POA: Diagnosis not present

## 2022-10-06 DIAGNOSIS — Y92410 Unspecified street and highway as the place of occurrence of the external cause: Secondary | ICD-10-CM | POA: Diagnosis not present

## 2022-10-06 DIAGNOSIS — S40011A Contusion of right shoulder, initial encounter: Secondary | ICD-10-CM | POA: Insufficient documentation

## 2022-10-06 DIAGNOSIS — R0781 Pleurodynia: Secondary | ICD-10-CM | POA: Diagnosis present

## 2022-10-06 MED ORDER — MELOXICAM 7.5 MG PO TABS
15.0000 mg | ORAL_TABLET | Freq: Once | ORAL | Status: AC
  Administered 2022-10-06: 15 mg via ORAL
  Filled 2022-10-06: qty 2

## 2022-10-06 MED ORDER — MELOXICAM 15 MG PO TABS: 15.0000 mg | ORAL_TABLET | Freq: Every day | ORAL | 0 refills | Status: DC
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
## 2023-07-11 ENCOUNTER — Encounter: Payer: Self-pay | Admitting: Emergency Medicine

## 2023-07-11 ENCOUNTER — Emergency Department
Admission: EM | Admit: 2023-07-11 | Discharge: 2023-07-11 | Disposition: A | Discharge status: Home / Self Care | Attending: Emergency Medicine | Admitting: Emergency Medicine

## 2023-07-11 ENCOUNTER — Other Ambulatory Visit: Payer: Self-pay

## 2023-07-11 DIAGNOSIS — T40411A Poisoning by fentanyl or fentanyl analogs, accidental (unintentional), initial encounter: Secondary | ICD-10-CM | POA: Diagnosis present

## 2023-07-11 DIAGNOSIS — T40601A Poisoning by unspecified narcotics, accidental (unintentional), initial encounter: Secondary | ICD-10-CM

## 2023-07-11 MED ORDER — NALOXONE HCL 4 MG/0.1ML NA LIQD: NASAL | 0 refills | Status: AC

## 2023-07-11 NOTE — ED Notes (Signed)
 Patient able to stand independently at bedside. Swaying a little. Speaking in complete sentences. Patient reports he can not be discharged yet due to being "dizzy". Dr Hendrick Locke notified. Ok to discharge.

## 2023-07-11 NOTE — ED Notes (Signed)
 Patient provided with drink and snacks while waiting for ride/bus.

## 2023-07-11 NOTE — ED Notes (Signed)
 Patient is resting with his eyes closed.  RR even and unlabored.  Patient is easy to arouse with verbal stimuli.  No needs voiced at present.  No acute distress noted.

## 2023-07-11 NOTE — ED Notes (Signed)
 Assisted RN with repositioning pt in the bed and applying gown to pt. Provided urinal for pt.

## 2023-07-11 NOTE — ED Provider Notes (Signed)
   [facility] Provider Note    Event Date/Time   First MD Initiated Contact with Patient 07/11/23 0003     (approximate)   History   Overdose   HPI  Jorge Maldonado is a 68 y.o. male who presents to the emergency department today after accidental overdose on fentanyl .  The patient states that he was trying to get high.  Denies any suicidal ideation. States that he does use opioids somewhat frequently.  He cannot recall if he is ever overdosed before.  Denies any recent medical complaints. Patient was given narcan  by EMS.     Physical Exam   Triage Vital Signs: ED Triage Vitals [07/11/23 0006]  Encounter Vitals Group     BP      Systolic BP Percentile      Diastolic BP Percentile      Pulse      Resp      Temp      Temp src      SpO2      Weight 140 lb (63.5 kg)     Height 6' (1.829 m)     Head Circumference      Peak Flow      Pain Score 0     Pain Loc      Pain Education      Exclude from Growth Chart     Most recent vital signs: There were no vitals filed for this visit.  General: Awake, alert, oriented. CV:  Good peripheral perfusion. Regular rate and rhythm. Resp:  Normal effort. Lungs clear. Abd:  No distention.    ED Results / Procedures / Treatments   Labs (all labs ordered are listed, but only abnormal results are displayed) Labs Reviewed - No data to display   EKG  None   RADIOLOGY None   PROCEDURES:  Critical Care performed: No   MEDICATIONS ORDERED IN ED: Medications - No data to display   IMPRESSION / MDM / ASSESSMENT AND PLAN / ED COURSE  I reviewed the triage vital signs and the nursing notes.                              Differential diagnosis includes, but is not limited to, overdose  Patient's presentation is most consistent with acute presentation with potential threat to life or bodily function.   The patient is on the cardiac monitor to evaluate for evidence of arrhythmia and/or  significant heart rate changes.  Patient presented to the emergency department today after accidental overdose on fentanyl .  Patient denies any SI.  Was given Narcan  by EMS.  Will place patient on cardiac monitor.  Will monitor for signs of respiratory depression.   Patient was observed in the emergency department for a number of hours without any further episodes of respiratory distress.  At this time I think it is safe for patient be discharged.   FINAL CLINICAL IMPRESSION(S) / ED DIAGNOSES   Final diagnoses:  Opiate overdose, accidental or unintentional, initial encounter [facility])      Note:  This document was prepared using Dragon voice recognition software and may include unintentional dictation errors.    Marylynn Soho, MD 07/11/23 [phone]
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
## 2024-01-23 ENCOUNTER — Emergency Department
Admission: EM | Admit: 2024-01-23 | Discharge: 2024-01-23 | Discharge status: Against Medical Advice | Attending: Emergency Medicine | Admitting: Emergency Medicine

## 2024-01-23 ENCOUNTER — Other Ambulatory Visit: Payer: Self-pay

## 2024-01-23 DIAGNOSIS — R079 Chest pain, unspecified: Secondary | ICD-10-CM | POA: Diagnosis not present

## 2024-01-23 DIAGNOSIS — Z5321 Procedure and treatment not carried out due to patient leaving prior to being seen by health care provider: Secondary | ICD-10-CM | POA: Diagnosis not present

## 2024-01-23 DIAGNOSIS — R10A2 Flank pain, left side: Secondary | ICD-10-CM | POA: Insufficient documentation

## 2024-01-23 DIAGNOSIS — R110 Nausea: Secondary | ICD-10-CM | POA: Insufficient documentation

## 2024-01-23 NOTE — ED Triage Notes (Signed)
 Arrived by [facility] from [employer]. C/o left flank pain X2 days. Reports pain with palpation. Reports nausea   EMS vitals: 163/98 b/p 96HR 97% RA

## 2024-01-23 NOTE — ED Notes (Signed)
 Patient states he feels better and he's leaving.

## 2024-01-23 NOTE — ED Triage Notes (Signed)
 Patient to triage room and states he is having left sided chest pain x 2 days, denies flank pain and urinary symptoms.

## 2024-01-29 ENCOUNTER — Emergency Department

## 2024-01-29 ENCOUNTER — Other Ambulatory Visit: Payer: Self-pay

## 2024-01-29 ENCOUNTER — Emergency Department
Admission: EM | Admit: 2024-01-29 | Discharge: 2024-01-30 | Disposition: A | Discharge status: Other Acute Inpatient Hospital | Attending: Emergency Medicine | Admitting: Emergency Medicine

## 2024-01-29 DIAGNOSIS — F1410 Cocaine abuse, uncomplicated: Secondary | ICD-10-CM | POA: Insufficient documentation

## 2024-01-29 DIAGNOSIS — R45851 Suicidal ideations: Secondary | ICD-10-CM | POA: Diagnosis not present

## 2024-01-29 DIAGNOSIS — F1910 Other psychoactive substance abuse, uncomplicated: Secondary | ICD-10-CM | POA: Diagnosis not present

## 2024-01-29 DIAGNOSIS — R209 Unspecified disturbances of skin sensation: Secondary | ICD-10-CM | POA: Diagnosis not present

## 2024-01-29 DIAGNOSIS — Y902 Blood alcohol level of 40-59 mg/100 ml: Secondary | ICD-10-CM | POA: Diagnosis not present

## 2024-01-29 DIAGNOSIS — I10 Essential (primary) hypertension: Secondary | ICD-10-CM | POA: Insufficient documentation

## 2024-01-29 DIAGNOSIS — I69398 Other sequelae of cerebral infarction: Secondary | ICD-10-CM | POA: Diagnosis not present

## 2024-01-29 DIAGNOSIS — F1010 Alcohol abuse, uncomplicated: Secondary | ICD-10-CM | POA: Insufficient documentation

## 2024-01-29 LAB — URINE DRUG SCREEN, QUALITATIVE (ARMC ONLY)
Amphetamines, Ur Screen: NOT DETECTED
Barbiturates, Ur Screen: NOT DETECTED
Benzodiazepine, Ur Scrn: NOT DETECTED
Cannabinoid 50 Ng, Ur ~~LOC~~: NOT DETECTED
Cocaine Metabolite,Ur ~~LOC~~: POSITIVE — AB
MDMA (Ecstasy)Ur Screen: NOT DETECTED
Methadone Scn, Ur: NOT DETECTED
Opiate, Ur Screen: NOT DETECTED
Phencyclidine (PCP) Ur S: NOT DETECTED
Tricyclic, Ur Screen: NOT DETECTED

## 2024-01-29 LAB — COMPREHENSIVE METABOLIC PANEL WITH GFR
ALT: 25 U/L (ref 0–44)
AST: 28 U/L (ref 15–41)
Albumin: 4.1 g/dL (ref 3.5–5.0)
Alkaline Phosphatase: 71 U/L (ref 38–126)
Anion gap: 12 (ref 5–15)
BUN: 8 mg/dL (ref 8–23)
CO2: 24 mmol/L (ref 22–32)
Calcium: 9.2 mg/dL (ref 8.9–10.3)
Chloride: 103 mmol/L (ref 98–111)
Creatinine, Ser: 0.72 mg/dL (ref 0.61–1.24)
GFR, Estimated: >60 mL/min (ref >60)
Glucose, Bld: 155 mg/dL — ABNORMAL HIGH (ref 70–99)
Potassium: 3.6 mmol/L (ref 3.5–5.1)
Sodium: 139 mmol/L (ref 135–145)
Total Bilirubin: 1.1 mg/dL (ref 0.0–1.2)
Total Protein: 8.4 g/dL — ABNORMAL HIGH (ref 6.5–8.1)

## 2024-01-29 LAB — CBC
HCT: 44.6 % (ref 39.0–52.0)
Hemoglobin: 14.3 g/dL (ref 13.0–17.0)
MCH: 29.1 pg (ref 26.0–34.0)
MCHC: 32.1 g/dL (ref 30.0–36.0)
MCV: 90.7 fL (ref 80.0–100.0)
Platelets: 312 K/uL (ref 150–400)
RBC: 4.92 MIL/uL (ref 4.22–5.81)
RDW: 14.4 % (ref 11.5–15.5)
WBC: 13.4 K/uL — ABNORMAL HIGH (ref 4.0–10.5)
nRBC: 0 % (ref 0.0–0.2)

## 2024-01-29 LAB — ETHANOL: Alcohol, Ethyl (B): 56 mg/dL — ABNORMAL HIGH (ref <15)

## 2024-01-29 LAB — ACETAMINOPHEN LEVEL: Acetaminophen (Tylenol), Serum: <10 ug/mL — ABNORMAL LOW (ref 10–30)

## 2024-01-29 LAB — SALICYLATE LEVEL: Salicylate Lvl: <7 mg/dL — ABNORMAL LOW (ref 7.0–30.0)

## 2024-01-29 MED ORDER — THIAMINE MONONITRATE 100 MG PO TABS
100.0000 mg | ORAL_TABLET | Freq: Every day | ORAL | Status: DC
  Administered 2024-01-29 – 2024-01-30 (×2): 100 mg via ORAL
  Filled 2024-01-29 (×2): qty 1

## 2024-01-29 MED ORDER — LORAZEPAM 2 MG/ML IJ SOLN: 0.0000 mg | Freq: Four times a day (QID) | INTRAMUSCULAR | Status: DC

## 2024-01-29 MED ORDER — LORAZEPAM 2 MG PO TABS: 0.0000 mg | ORAL_TABLET | Freq: Two times a day (BID) | ORAL | Status: DC

## 2024-01-29 MED ORDER — ASPIRIN 81 MG PO TBEC
81.0000 mg | DELAYED_RELEASE_TABLET | Freq: Every day | ORAL | Status: DC
  Administered 2024-01-29 – 2024-01-30 (×2): 81 mg via ORAL
  Filled 2024-01-29 (×2): qty 1

## 2024-01-29 MED ORDER — THIAMINE HCL 100 MG/ML IJ SOLN
100.0000 mg | Freq: Every day | INTRAMUSCULAR | Status: DC
  Filled 2024-01-29 (×2): qty 1

## 2024-01-29 MED ORDER — LORAZEPAM 2 MG/ML IJ SOLN: 0.0000 mg | Freq: Two times a day (BID) | INTRAMUSCULAR | Status: DC

## 2024-01-29 MED ORDER — LORAZEPAM 2 MG PO TABS
0.0000 mg | ORAL_TABLET | Freq: Four times a day (QID) | ORAL | Status: DC
  Administered 2024-01-29 (×2): 1 mg via ORAL
  Administered 2024-01-30: 2 mg via ORAL
  Filled 2024-01-29 (×3): qty 1

## 2024-01-29 MED ORDER — AMLODIPINE BESYLATE 5 MG PO TABS
5.0000 mg | ORAL_TABLET | Freq: Every day | ORAL | Status: DC
  Administered 2024-01-29: 5 mg via ORAL
  Filled 2024-01-29: qty 1

## 2024-01-29 NOTE — ED Notes (Signed)
 VOL CONSULT  DONE  PENDING  PLACEMENT

## 2024-01-29 NOTE — Consult Note (Signed)
 [facility] Psychiatric Consult Initial  Patient Name: .Jorge Maldonado  MRN: [id number]  DOB: 10-20-1955  Consult Order details:  Orders (From admission, onward)     Start     Ordered   01/29/24 0718  IP CONSULT TO PSYCHIATRY       Ordering Provider: Neomi Josette SAILOR, DO  Provider:  (Not yet assigned)  Question Answer Comment  Reason for consult: Other (see comments)   Comments: SI      01/29/24 0717   01/29/24 0636  CONSULT TO CALL ACT TEAM       Ordering Provider: Neomi Josette SAILOR, DO  Provider:  (Not yet assigned)  Question:  Reason for Consult?  Answer:  Psych consult   01/29/24 0635             Mode of Visit: In person    Psychiatry Consult Evaluation  Service Date: January 29, 2024 LOS:  LOS: 0 days  Chief Complaint Burnt out  Primary Psychiatric Diagnoses  Cocaine abuse Alcohol abuse   Assessment  DAWSEN KRIEGER is a 68 y.o. male admitted: Presented to the ED  On assessment today, patient denies suicidal ideations or homicidal ideations.  Patient denied any auditory or visual hallucinations.  Patient reported his main goal from psychiatry is to get into drug rehab somewhere.  Patient reported feelings of worthlessness related to his substance use and reported desire to quit.On current presentation there was no evidence of psychosis or mania and patient did not appear to be responding to internal stimuli. At this time, patient does not appear to be a risk to self or others.While future psychiatric events cannot be accurately predicted, the patient does not currently require acute inpatient psychiatric care and does not currently meet [place]  involuntary commitment criteria.  At this time patient is agreeable for TTS to fax his information for potential inpatient rehabilitation facilities.  Diagnoses:  Active Hospital problems: Active Problems:   Cocaine abuse (HCC)   Alcohol abuse    Plan   ## Psychiatric Medication Recommendations:  None at  this time  ## Medical Decision Making Capacity: Not specifically addressed in this encounter  ## Further Work-up:   -- most recent EKG on 01/29/2024 had QtC of 451 -- Pertinent labwork reviewed earlier this admission includes: CMP, ethanol, CBC, urine drug screen, salicylate level, acetaminophen  level   ## Disposition:--We are referring for potential inpatient rehabilitation related to substance use  ## Behavioral / Environmental: - No specific recommendations at this time.     ## Safety and Observation Level:  - Based on my clinical evaluation, I estimate the patient to be at low risk of self harm in the current setting. - At this time, we recommend  routine. This decision is based on my review of the chart including patient's history and current presentation, interview of the patient, mental status examination, and consideration of suicide risk including evaluating suicidal ideation, plan, intent, suicidal or self-harm behaviors, risk factors, and protective factors. This judgment is based on our ability to directly address suicide risk, implement suicide prevention strategies, and develop a safety plan while the patient is in the clinical setting. Please contact our team if there is a concern that risk level has changed.  CSSR Risk Category:C-SSRS RISK CATEGORY: No Risk  Suicide Risk Assessment: Patient has following modifiable risk factors for suicide: lack of access to outpatient mental health resources, which we are addressing by referring patient to inpatient substance use facilities. Patient has following non-modifiable or  demographic risk factors for suicide: male gender Patient has the following protective factors against suicide: Supportive family  Thank you for this consult request. Recommendations have been communicated to the primary team.  We will sign off at this time.   Zelda Sharps, NP    History of Present Illness  Relevant Aspects of Hospital ED   Patient Report:   On assessment today, patient denies suicidal ideations or homicidal ideations.  Patient denied any auditory or visual hallucinations.  Patient reported his main goal from psychiatry is to get into drug rehab somewhere.  Patient reported feelings of worthlessness related to his substance use and reported desire to quit.On current presentation there was no evidence of psychosis or mania and patient did not appear to be responding to internal stimuli. At this time, patient does not appear to be a risk to self or others.While future psychiatric events cannot be accurately predicted, the patient does not currently require acute inpatient psychiatric care and does not currently meet [place]  involuntary commitment criteria.  At this time patient is agreeable for TTS to fax his information for potential inpatient rehabilitation facilities.  Patient reported daily use of cocaine as well as alcohol.  Patient reported previous use of heroin and fentanyl  as well.  He reported he last used heroin and fentanyl  1 month ago.  He did report going to [facility] for a 10-day rehabilitation program about 1 month ago.  He reported currently he is using only cocaine and alcohol.  When asked about alcohol use, patient reported drinking everything.  He reported mainly drinking beer that.  He denied any previous history of withdrawal seizures.  Patient reported last using cocaine and alcohol yesterday.  Patient did report upcoming court dates for pending legal charges.  Did not elaborate to psychiatry team what these charges were for at this time.  Patient denied any access to any weapons.  Psych ROS:  Depression: Related to substance use Anxiety: Endorsed Mania (lifetime and current): Denied Psychosis: (lifetime and current): Denied      Psychiatric and Social History  Psychiatric History:  Information collected from patient  Prev Dx/Sx: Polysubstance use, depression Current Psych Provider: Denied Home Meds  (current): None Previous Med Trials: Unsure Therapy: Denied  Prior Psych Hospitalization: Previous inpatient rehab stays Prior Self Harm: Patient reported 15 years ago overdosing intentionally on pills to harm himself Prior Violence: Denied  Family Psych History: Denied Family Hx suicide: Denied  Social History:   Educational Hx: Unknown Legal Hx: Endorsed upcoming court dates Living Situation: Alone Spiritual Hx: Unknown Access to weapons/lethal means: Denied  Substance History Alcohol: Endorsed daily use Type of alcohol reported mainly beer Last Drink yesterday Number of drinks per day patient reported as much as I can History of alcohol withdrawal seizures denied History of DT's denied Tobacco: Daily Illicit drugs: Cocaine daily Prescription drug abuse: Denied Rehab hx: Endorsed  Exam Findings  Physical Exam: Deferred to EDP- note reviewed   Vital Signs:  Temp:  [97.8 F (36.6 C)] 97.8 F (36.6 C) (11/06 1344) Pulse Rate:  [74-110] 74 (11/06 1344) Resp:  [16-17] 16 (11/06 1344) BP: (141-165)/(88-116) 158/90 (11/06 1344) SpO2:  [98 %-100 %] 98 % (11/06 1344) Weight:  [68 kg] 68 kg (11/06 0536) Blood pressure (!) 158/90, pulse 74, temperature 97.8 F (36.6 C), temperature source Oral, resp. rate 16, height 6' 1 (1.854 m), weight 68 kg, SpO2 98%. Body mass index is 19.79 kg/m.    Mental Status Exam: General Appearance: Casual  Orientation:  Full (Time, Place, and Person)  Memory:  Immediate;   Fair Recent;   Fair Remote;   Fair  Concentration:  Concentration: Fair and Attention Span: Fair  Recall:  Fair  Attention  Fair  Eye Contact:  Minimal  Speech:  Clear and Coherent  Language:  Fair  Volume:  Normal  Mood: anxious  Affect:  Appropriate  Thought Process:  Coherent and Goal Directed  Thought Content:  Logical  Suicidal Thoughts:  No  Homicidal Thoughts:  No  Judgement:  Impaired  Insight:  Shallow  Psychomotor Activity:  Normal  Akathisia:   No  Fund of Knowledge:  Fair      Assets:  Communication Skills Desire for Improvement  Cognition:  WNL  ADL's:  Intact  AIMS (if indicated):        Other History   These have been pulled in through the EMR, reviewed, and updated if appropriate.  Family History:  The patient's family history is not on file.  Medical History: Past Medical History:  Diagnosis Date  . Hypertension     Surgical History: Past Surgical History:  Procedure Laterality Date  . FRACTURE SURGERY    . INCISION AND DRAINAGE Left 09/13/2022   Procedure: I&D and ligation of vein;  Surgeon: Serene Gaile ORN, MD;  Location: [facility] ORS;  Service: Vascular;  Laterality: Left;     Medications:   Current Facility-Administered Medications:  .  amLODipine (NORVASC) tablet 5 mg, 5 mg, Oral, Daily, Ward, Kristen N, DO, 5 mg at 01/29/24 1012 .  aspirin EC tablet 81 mg, 81 mg, Oral, Daily, Ward, Kristen N, DO, 81 mg at 01/29/24 1012 .  LORazepam  (ATIVAN ) injection 0-4 mg, 0-4 mg, Intravenous, Q6H **OR** LORazepam  (ATIVAN ) tablet 0-4 mg, 0-4 mg, Oral, Q6H, Ward, Kristen N, DO, 1 mg at 01/29/24 1303 .  [START ON 01/31/2024] LORazepam  (ATIVAN ) injection 0-4 mg, 0-4 mg, Intravenous, Q12H **OR** [START ON 01/31/2024] LORazepam  (ATIVAN ) tablet 0-4 mg, 0-4 mg, Oral, Q12H, Ward, Kristen N, DO .  thiamine  (VITAMIN B1) tablet 100 mg, 100 mg, Oral, Daily, 100 mg at 01/29/24 1012 **OR** thiamine  (VITAMIN B1) injection 100 mg, 100 mg, Intravenous, Daily, Ward, Kristen N, DO  Current Outpatient Medications:  .  naloxone  (NARCAN ) nasal spray 4 mg/0.1 mL, Use in case of respiratory distress and opioid overdose (Patient not taking: Reported on 01/29/2024), Disp: 1 each, Rfl: 0  Allergies: No Known Allergies  Zelda Sharps, NP This note was created using Scientist, clinical (histocompatibility and immunogenetics). Please excuse any inadvertent transcription errors. Case was discussed with supervising physician Dr. Jadapalle who is agreeable with current plan.

## 2024-01-29 NOTE — BH Assessment (Signed)
 Patient referred for substance abuse treatment:   [name]  [phone] --  [facility]  [phone] --  [place] EFAX  [phone] --  [facility]  [phone]

## 2024-01-29 NOTE — BH Assessment (Signed)
 PATIENT BED AVAILABLE AFTER 8AM ON 01/30/24  Patient has been accepted to [facility].  Patient assigned to [facility] Accepting physician is Dr. Jess.  Call report to [phone].  Representative was [employer].   ER Staff is aware of it:  [facility] ER Secretary  Dr. Levander, ER MD  [facility] Patient's Nurse

## 2024-01-29 NOTE — ED Notes (Signed)
 Meal tray provided. Tray checked for potential hazards. Pt denies no other needs at this time.

## 2024-01-29 NOTE — ED Triage Notes (Signed)
 Pt to ED via [facility] VOL, pt reports thoughts of SI today, pt denies taking psychiatric medications but has struggled with same in the past.

## 2024-01-29 NOTE — ED Provider Notes (Signed)
 [facility] Provider Note    Event Date/Time   First MD Initiated Contact with Patient 01/29/24 [phone]     (approximate)   History   Psychiatric Evaluation   HPI  Jorge Maldonado is a 68 y.o. male with history of alcohol and cocaine use, hypertension not currently on medications who presents to the emergency department with suicidal thoughts without plan.  No HI, hallucinations.  Reports drinking alcohol and using cocaine today.  No history of withdrawal seizures or hallucinations from withdrawals.  He is hypertensive here.  Does complain of headache.  States he has had numbness in the left arm for the past 7 weeks and thinks he had a stroke 7 weeks ago.  No head injury.  No other numbness or weakness.  No chest pain or shortness of breath.   History provided by patient.    Past Medical History:  Diagnosis Date   Hypertension     Past Surgical History:  Procedure Laterality Date   FRACTURE SURGERY     INCISION AND DRAINAGE Left 09/13/2022   Procedure: I&D and ligation of vein;  Surgeon: Serene Gaile ORN, MD;  Location: [facility] ORS;  Service: Vascular;  Laterality: Left;    MEDICATIONS:  Prior to Admission medications   Medication Sig Start Date End Date Taking? Authorizing Provider  naloxone  (NARCAN ) nasal spray 4 mg/0.1 mL Use in case of respiratory distress and opioid overdose 07/11/23   Floy Roberts, MD    Physical Exam   Triage Vital Signs: ED Triage Vitals  Encounter Vitals Group     BP 01/29/24 0537 (!) 165/116     Girls Systolic BP Percentile --      Girls Diastolic BP Percentile --      Boys Systolic BP Percentile --      Boys Diastolic BP Percentile --      Pulse Rate 01/29/24 0537 76     Resp 01/29/24 0537 17     Temp 01/29/24 0537 97.8 F (36.6 C)     Temp src --      SpO2 01/29/24 0537 99 %     Weight 01/29/24 0536 150 lb (68 kg)     Height 01/29/24 0536 6' 1 (1.854 m)     Head Circumference --      Peak Flow --       Pain Score 01/29/24 0536 8     Pain Loc --      Pain Education --      Exclude from Growth Chart --     Most recent vital signs: Vitals:   01/29/24 0721 01/29/24 0725  BP: (!) 141/88 (!) 141/88  Pulse: 82 82  Resp: 16   Temp:    SpO2: 100%     CONSTITUTIONAL: Alert, responds appropriately to questions.  Chronically ill-appearing, appears older than stated age HEAD: Normocephalic, atraumatic EYES: Conjunctivae clear, pupils appear equal, sclera nonicteric ENT: normal nose; moist mucous membranes NECK: Supple, normal ROM CARD: RRR; S1 and S2 appreciated RESP: Normal chest excursion without splinting or tachypnea; breath sounds clear and equal bilaterally; no wheezes, no rhonchi, no rales, no hypoxia or respiratory distress, speaking full sentences ABD/GI: Non-distended; soft, non-tender, no rebound, no guarding, no peritoneal signs BACK: The back appears normal EXT: Normal ROM in all joints; no deformity noted, no edema SKIN: Normal color for age and race; warm; no rash on exposed skin NEURO: Moves all extremities equally, normal speech, diminished sensation of the left arm compared to  the right, otherwise normal sensation in the legs and face, no drift, cranial nerves II through XII intact PSYCH: Patient has SI without plan.  No HI.  Not responding to internal stimuli.  Calm and cooperative.   ED Results / Procedures / Treatments   LABS: (all labs ordered are listed, but only abnormal results are displayed) Labs Reviewed  COMPREHENSIVE METABOLIC PANEL WITH GFR - Abnormal; Notable for the following components:      Result Value   Glucose, Bld 155 (*)    Total Protein 8.4 (*)    All other components within normal limits  ETHANOL - Abnormal; Notable for the following components:   Alcohol, Ethyl (B) 56 (*)    All other components within normal limits  CBC - Abnormal; Notable for the following components:   WBC 13.4 (*)    All other components within normal limits   SALICYLATE LEVEL - Abnormal; Notable for the following components:   Salicylate Lvl <7.0 (*)    All other components within normal limits  ACETAMINOPHEN  LEVEL - Abnormal; Notable for the following components:   Acetaminophen  (Tylenol ), Serum <10 (*)    All other components within normal limits  URINE DRUG SCREEN, QUALITATIVE [facility] ONLY)     EKG:   EKG Interpretation Date/Time:  Thursday January 29 2024 07:15:56 EST Ventricular Rate:  79 PR Interval:  162 QRS Duration:  100 QT Interval:  394 QTC Calculation: 451 R Axis:   58  Text Interpretation: Sinus rhythm with Premature atrial complexes RSR' or QR pattern in V1 suggests right ventricular conduction delay Minimal voltage criteria for LVH, may be normal variant ( Sokolow-Lyon ) Borderline ECG When compared with ECG of 13-Sep-2022 21:04, PREVIOUS ECG IS PRESENT Confirmed by Neomi Neptune [phone]) on 01/29/2024 7:18:06 AM        RADIOLOGY: My personal review and interpretation of imaging: CT head unremarkable.  I have personally reviewed all radiology reports.   CT HEAD WO CONTRAST ( ) Result Date: 01/29/2024 EXAM: CT HEAD WITHOUT CONTRAST 01/29/2024 07:09:39 AM TECHNIQUE: CT of the head was performed without the administration of intravenous contrast. Automated exposure control, iterative reconstruction, and/or weight based adjustment of the mA/kV was utilized to reduce the radiation dose to as low as reasonably achievable. COMPARISON: Head CT 09/13/2022. CLINICAL HISTORY: 68 year old male. Headache, left-sided numbness x 7 weeks. FINDINGS: BRAIN AND VENTRICLES: No acute hemorrhage. No evidence of acute infarct. No hydrocephalus. No extra-axial collection. No mass effect or midline shift. New but chronic appearing patchy cortical and subcortical white matter encephalomalacia in the right middle frontal gyrus (series 2 images 22 through 25), right MCA anterior or middle division territory. Small but circumscribed hypodense  encephalomalacia there. Elsewhere gray white differentiation is stable with only mild periventricular chronic white matter hypodensity. Calcified atherosclerosis of the skull base. Distal left vertebral artery appears dominant, normal variant. ORBITS: Chronic left lateral orbit wall ORIF. No acute orbit abnormality. SINUSES: Chronic left maxilla ORIF. Visible paranasal sinuses, middle ears and mastoids remain well aerated. SOFT TISSUES AND SKULL: Chronic left zygomatic arch fracture. No acute soft tissue abnormality. No skull fracture. IMPRESSION: 1. No acute intracranial abnormality. But patchy right MCA / right middle frontal gyrus infarcts appear chronic but new since last year. Electronically signed by: Helayne Hurst MD 01/29/2024 07:15 AM EST RP Workstation: [id number]     PROCEDURES:  Critical Care performed: No      Procedures    IMPRESSION / MDM / ASSESSMENT AND PLAN / ED COURSE  I reviewed the triage vital signs and the nursing notes.    Patient here with complaints of headache, suicidal thoughts, drug and alcohol use.     DIFFERENTIAL DIAGNOSIS (includes but not limited to):   Substance use disorder, intoxication, depression, anxiety, intracranial hemorrhage, hypertensive urgency/emergency, hypertensive headache, migraine, subacute stroke   Patient's presentation is most consistent with acute presentation with potential threat to life or bodily function.   PLAN: Will obtain screening labs, urine, EKG, CT head.  Discussed with patient given numbness in the left arm has been ongoing for 7 weeks, I do not feel he needs further emergent workup for this if head CT is negative for acute intracranial hemorrhage.  Blood pressure elevated but will allow for permissive hypertension at this time and continue to monitor.  Will give Tylenol  for pain.  He is here voluntarily.  TTS and psychiatry have been consulted.      MEDICATIONS GIVEN IN ED: Medications  aspirin EC tablet 81 mg  (has no administration in time range)  amLODipine (NORVASC) tablet 5 mg (has no administration in time range)  LORazepam  (ATIVAN ) injection 0-4 mg ( Intravenous See Alternative 01/29/24 0732)    Or  LORazepam  (ATIVAN ) tablet 0-4 mg (1 mg Oral Given 01/29/24 0732)  LORazepam  (ATIVAN ) injection 0-4 mg (has no administration in time range)    Or  LORazepam  (ATIVAN ) tablet 0-4 mg (has no administration in time range)  thiamine  (VITAMIN B1) tablet 100 mg (has no administration in time range)    Or  thiamine  (VITAMIN B1) injection 100 mg (has no administration in time range)     ED COURSE:  7:19 AM  Pt's CT head reviewed and interpreted by myself and the radiologist and shows no acute abnormality but does show chronic appearing infarcts in the right frontal and MCA area consistent with his complaints of left arm numbness.  Will start him on aspirin daily and have him follow-up as an outpatient with neurology.  Will also start him on amlodipine for hypertension.  I do not feel he needs further emergent workup or medical admission for this given itstarted 7 weeks ago.  He is not a tPA candidate.  Labs show alcohol level of 56.  No sign of active withdrawal.  Negative Tylenol  and salicylate level.  Normal hemoglobin, electrolytes.  Medically cleared at this time for psychiatric evaluation and disposition.  Blood pressure has improved to 141/88.  He is sitting upright in the bed eating and drinking and appears very comfortable.   CONSULTS: TTS and psychiatry consulted.   OUTSIDE RECORDS REVIEWED: Reviewed previous vascular surgery notes after traumatic injury to the neck in June 2024.       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   Arlita Buffkin, Josette SAILOR, DO 01/29/24 [phone]

## 2024-01-29 NOTE — ED Notes (Signed)
 EDP notified of pt's elevated bps today. EDP instructed RN to continue monitoring the pt's BP and  that pt is already prescribed 5mg  of amlodipine daily.

## 2024-01-29 NOTE — BH Assessment (Addendum)
 Comprehensive Clinical Assessment (CCA) Note  01/29/2024 Jorge Maldonado [id number]  Chief Complaint:  Chief Complaint  Patient presents with   Psychiatric Evaluation   Visit Diagnosis: Substance Use Disorder  Jorge Maldonado is a 68 year old male who presents to the ER seeking assistance for his substance use. Patient self-report the use of alcohol and cocaine and yesterday was the last time he used. Patient denies the use of any other mind-altering substance. He denies SI/HI and AV/H. He reports of having an upcoming court date but don't remember the date. Symptoms of withdrawal are; shakes and some dizziness.  CCA Screening, Triage and Referral (STR)  Patient Reported Information How did you hear about us ? Self  What Is the Reason for Your Visit/Call Today? Patient seeking substance treatment.  How Long Has This Been Causing You Problems? 1 wk - 1 month  What Do You Feel Would Help You the Most Today? Alcohol or Drug Use Treatment   Have You Recently Had Any Thoughts About Hurting Yourself? No  Are You Planning to Commit Suicide/Harm Yourself At This time? No   Flowsheet Row ED from 01/29/2024 in [facility] Emergency Department at [facility] ED from 07/11/2023 in [facility] Emergency Department at [facility] ED from 10/06/2022 in [facility] Emergency Department at [facility]  C-SSRS RISK CATEGORY No Risk No Risk No Risk    Have you Recently Had Thoughts About Hurting Someone Sherral? No  Are You Planning to Harm Someone at This Time? No  Explanation: No data recorded  Have You Used Any Alcohol or Drugs in the Past 24 Hours? Yes  How Long Ago Did You Use Drugs or Alcohol? No data recorded What Did You Use and How Much? No data recorded  Do You Currently Have a Therapist/Psychiatrist? No  Name of Therapist/Psychiatrist:    Have You Been Recently Discharged From Any Office Practice or Programs? No  Explanation of Discharge From Practice/Program:  No data recorded    CCA Screening Triage Referral Assessment Type of Contact: Face-to-Face  Telemedicine Service Delivery:   Is this Initial or Reassessment?   Date Telepsych consult ordered in [facility]:    Time Telepsych consult ordered in [facility]:    Location of Assessment: [facility] ED  Provider Location: [facility] ED   Collateral Involvement: No data recorded  Does Patient Have a Court Appointed Legal Guardian? No  Legal Guardian Contact Information: No data recorded Copy of Legal Guardianship Form: No data recorded Legal Guardian Notified of Arrival: No data recorded Legal Guardian Notified of Pending Discharge: No data recorded If Minor and Not Living with Parent(s), Who has Custody? No data recorded Is CPS involved or ever been involved? Never  Is APS involved or ever been involved? Never   Patient Determined To Be At Risk for Harm To Self or Others Based on Review of Patient Reported Information or Presenting Complaint? No  Method: No data recorded Availability of Means: No data recorded Intent: No data recorded Notification Required: No data recorded Additional Information for Danger to Others Potential: No data recorded Additional Comments for Danger to Others Potential: No data recorded Are There Guns or Other Weapons in Your Home? No  Types of Guns/Weapons: No data recorded Are These Weapons Safely Secured?                            No  Who Could Verify You Are Able To Have These Secured: No data recorded Do  You Have any Outstanding Charges, Pending Court Dates, Parole/Probation? No data recorded Contacted To Inform of Risk of Harm To Self or Others: No data recorded   Does Patient Present under Involuntary Commitment? No    [place] of Residence: [place]   Patient Currently Receiving the Following Services: Not Receiving Services   Determination of Need: Emergent (2 hours)   Options For Referral: ED Visit     CCA Biopsychosocial Patient Reported  Schizophrenia/Schizoaffective Diagnosis in Past: No   Strengths: Patient have some insight, seeking help and able to complete ADL's independently.   Mental Health Symptoms Depression:  Hopelessness   Duration of Depressive symptoms: Duration of Depressive Symptoms: N/A   Mania:  N/A   Anxiety:   N/A   Psychosis:  None   Duration of Psychotic symptoms:    Trauma:  N/A   Obsessions:  N/A   Compulsions:  N/A   Inattention:  N/A   Hyperactivity/Impulsivity:  N/A   Oppositional/Defiant Behaviors:  N/A   Emotional Irregularity:  N/A   Other Mood/Personality Symptoms:  No data recorded   Mental Status Exam Appearance and self-care  Stature:  Average   Weight:  Average weight   Clothing:  Neat/clean; Age-appropriate   Grooming:  Normal   Cosmetic use:  None   Posture/gait:  Normal   Motor activity:  -- (within normal range)   Sensorium  Attention:  Normal   Concentration:  Normal   Orientation:  X5   Recall/memory:  Normal   Affect and Mood  Affect:  Appropriate   Mood:  Other (Comment)   Relating  Eye contact:  Normal   Facial expression:  Sad   Attitude toward examiner:  Cooperative   Thought and Language  Speech flow: Clear and Coherent; Normal   Thought content:  Appropriate to Mood and Circumstances   Preoccupation:  None   Hallucinations:  None   Organization:  Intact; Coherent   [employer] of Knowledge:  Average   Intelligence:  Average   Abstraction:  Normal   Judgement:  Impaired   Reality Testing:  Adequate   Insight:  Fair   Decision Making:  Normal; Vacilates; Impulsive   Social Functioning  Social Maturity:  Isolates   Social Judgement:  Heedless; Chief Of Staff   Stress  Stressors:  Transitions; Housing   Coping Ability:  Exhausted   Skill Deficits:  None   Supports:  Support needed     Religion: Religion/Spirituality Are You A Religious Person?: No  Leisure/Recreation: Leisure /  Recreation Do You Have Hobbies?: No  Exercise/Diet: Exercise/Diet Do You Exercise?: No Have You Gained or Lost A Significant Amount of Weight in the Past Six Months?: No Do You Follow a Special Diet?: No Do You Have Any Trouble Sleeping?: No   CCA Employment/Education Employment/Work Situation: Employment / Work Situation Employment Situation: Unemployed Patient's Job has Been Impacted by Current Illness: No Has Patient ever Been in Equities Trader?: No  Education: Education Is Patient Currently Attending School?: No Did You Have An Individualized Education Program (IIEP): No Did You Have Any Difficulty At [employer]?: No Patient's Education Has Been Impacted by Current Illness: No   CCA Family/Childhood History Family and Relationship History: Family history Marital status: Single Does patient have children?: No  Childhood History:  Childhood History By whom was/is the patient raised?: Both parents Did patient suffer any verbal/emotional/physical/sexual abuse as a child?: No Did patient suffer from severe childhood neglect?: No Has patient ever been sexually abused/assaulted/raped as  an adolescent or adult?: No Was the patient ever a victim of a crime or a disaster?: No Witnessed domestic violence?: No Has patient been affected by domestic violence as an adult?: No   CCA Substance Use Alcohol/Drug Use: Alcohol / Drug Use Pain Medications: See MAR Prescriptions: See MAR Over the Counter: See MAR History of alcohol / drug use?: Yes Longest period of sobriety (when/how long): Unable quantify Negative Consequences of Use: Personal relationships Substance #1 Name of Substance 1: Cocaine 1 - Last Use / Amount: 01/28/2024 Substance #2 Name of Substance 2: Alcohol 2 - Last Use / Amount: 01/28/2024   ASAM's:  Six Dimensions of Multidimensional Assessment  Dimension 1:  Acute Intoxication and/or Withdrawal Potential:      Dimension 2:  Biomedical Conditions and  Complications:      Dimension 3:  Emotional, Behavioral, or Cognitive Conditions and Complications:     Dimension 4:  Readiness to Change:     Dimension 5:  Relapse, Continued use, or Continued Problem Potential:     Dimension 6:  Recovery/Living Environment:     ASAM Severity Score:    ASAM Recommended Level of Treatment:     Substance use Disorder (SUD)    Recommendations for Services/Supports/Treatments:    Disposition Recommendation per psychiatric provider: Inpatient Treatment   DSM5 Diagnoses: Patient Active Problem List   Diagnosis Date Noted   Traumatic injury of neck 09/14/2022   Suicidal ideation 02/26/2022   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

## 2024-01-29 NOTE — ED Notes (Signed)
 Pt belongings:  Designer, Multimedia 1 black shoe 1 grey shoe Purple hat Grey jacket

## 2024-01-29 NOTE — ED Notes (Signed)
 VOL  moved to  [facility]  pending  consult

## 2024-01-29 NOTE — ED Notes (Signed)
 Pt was sleeping during snack time.

## 2024-01-29 NOTE — ED Notes (Signed)
Door unlocked for pt to use bathroom. Door locked once pt finished.  °

## 2024-01-29 NOTE — ED Notes (Signed)
 Patient sleeping

## 2024-01-29 NOTE — ED Notes (Signed)
 Pt ABCs intact. RR even and unlabored. Pt in NAD. Bed in lowest locked position. Call bell in reach. Denies needs at this time.    Past Medical History:  Diagnosis Date   Hypertension

## 2024-01-29 NOTE — ED Notes (Addendum)
 Patient sleeping.Patient in assigned room

## 2024-01-29 NOTE — BH Assessment (Signed)
 Patient referred for substance abuse treatment:  Service Provider Phone  [facility]  [phone]  [facility]  [phone]  [facility]  [phone]  [facility]  [phone]  [facility]  [phone]  [facility]  [phone]

## 2024-01-30 DIAGNOSIS — F1410 Cocaine abuse, uncomplicated: Secondary | ICD-10-CM | POA: Diagnosis not present

## 2024-01-30 MED ORDER — ACETAMINOPHEN 500 MG PO TABS
1000.0000 mg | ORAL_TABLET | Freq: Four times a day (QID) | ORAL | Status: DC | PRN
  Administered 2024-01-30: 1000 mg via ORAL
  Filled 2024-01-30: qty 2

## 2024-01-30 MED ORDER — AMLODIPINE BESYLATE 5 MG PO TABS
10.0000 mg | ORAL_TABLET | Freq: Every day | ORAL | Status: DC
  Administered 2024-01-30: 10 mg via ORAL
  Filled 2024-01-30: qty 2

## 2024-01-30 NOTE — ED Notes (Signed)
 Vol/pt accepted to [facility] on 01/30/24 after 8am.

## 2024-01-30 NOTE — ED Notes (Signed)
 [facility] called for Transport to [facility] , spoke with Merlynn

## 2024-01-30 NOTE — ED Notes (Signed)
 Attempted to give report to [facility]. No answer. HIPAA compliant VM left.

## 2024-01-30 NOTE — ED Notes (Addendum)
 Pt informed this RN he has a headache and would like something for it. RN reached out to EDP about the matter. See new orders.

## 2024-01-30 NOTE — ED Notes (Signed)
 Patient sleeping

## 2024-01-30 NOTE — ED Notes (Addendum)
 EDP reached out to about pt's BP. See new orders for increased daily dose of BP medication.

## 2024-01-30 NOTE — ED Notes (Addendum)
 2nd attempt to call report to [facility]. Was told receiving RN was off the floor and would call me back.

## 2024-01-30 NOTE — ED Notes (Signed)
 Samule Salt, RN from [facility] returned my call for report at 10:29am.

## 2024-01-30 NOTE — ED Provider Notes (Signed)
 Emergency Medicine Observation Re-evaluation Note  Jorge Maldonado is a 68 y.o. male, seen on rounds today.  Pt initially presented to the ED for complaints of Psychiatric Evaluation Currently, the patient is resting.  Physical Exam  BP (!) 159/100   Pulse 82   Temp 98 F (36.7 C) (Oral)   Resp 18   Ht 6' 1 (1.854 m)   Wt 68 kg   SpO2 98%   BMI 19.79 kg/m   General: No distress   ED Course / MDM  EKG:EKG Interpretation Date/Time:  Thursday January 29 2024 07:15:56 EST Ventricular Rate:  79 PR Interval:  162 QRS Duration:  100 QT Interval:  394 QTC Calculation: 451 R Axis:   58  Text Interpretation: Sinus rhythm with Premature atrial complexes RSR' or QR pattern in V1 suggests right ventricular conduction delay Minimal voltage criteria for LVH, may be normal variant ( Sokolow-Lyon ) Borderline ECG When compared with ECG of 13-Sep-2022 21:04, PREVIOUS ECG IS PRESENT Confirmed by Neomi Neptune [phone]) on 01/29/2024 7:18:06 AM  I have reviewed the labs performed to date as well as medications administered while in observation.  Recent changes in the last 24 hours include seen by psych, accepted to [facility].  Plan  Current plan is for placement.    Claudene Rover, MD 01/30/24 [phone]

## 2024-02-27 ENCOUNTER — Emergency Department
Admission: EM | Admit: 2024-02-27 | Discharge: 2024-02-28 | Disposition: A | Attending: Emergency Medicine | Admitting: Emergency Medicine

## 2024-02-27 ENCOUNTER — Other Ambulatory Visit: Payer: Self-pay

## 2024-02-27 DIAGNOSIS — R4182 Altered mental status, unspecified: Secondary | ICD-10-CM | POA: Diagnosis present

## 2024-02-27 DIAGNOSIS — Z5900 Homelessness unspecified: Secondary | ICD-10-CM | POA: Insufficient documentation

## 2024-02-27 DIAGNOSIS — F1910 Other psychoactive substance abuse, uncomplicated: Secondary | ICD-10-CM

## 2024-02-27 LAB — CBC WITH DIFFERENTIAL/PLATELET
Abs Immature Granulocytes: 0.05 K/uL (ref 0.00–0.07)
Basophils Absolute: 0.1 K/uL (ref 0.0–0.1)
Basophils Relative: 1 %
Eosinophils Absolute: 0.1 K/uL (ref 0.0–0.5)
Eosinophils Relative: 1 %
HCT: 42 % (ref 39.0–52.0)
Hemoglobin: 14 g/dL (ref 13.0–17.0)
Immature Granulocytes: 1 %
Lymphocytes Relative: 14 %
Lymphs Abs: 1.6 K/uL (ref 0.7–4.0)
MCH: 29.8 pg (ref 26.0–34.0)
MCHC: 33.3 g/dL (ref 30.0–36.0)
MCV: 89.4 fL (ref 80.0–100.0)
Monocytes Absolute: 0.9 K/uL (ref 0.1–1.0)
Monocytes Relative: 8 %
Neutro Abs: 8.3 K/uL — ABNORMAL HIGH (ref 1.7–7.7)
Neutrophils Relative %: 75 %
Platelets: 292 K/uL (ref 150–400)
RBC: 4.7 MIL/uL (ref 4.22–5.81)
RDW: 14.4 % (ref 11.5–15.5)
WBC: 11.1 K/uL — ABNORMAL HIGH (ref 4.0–10.5)
nRBC: 0 % (ref 0.0–0.2)

## 2024-02-27 LAB — COMPREHENSIVE METABOLIC PANEL WITH GFR
ALT: 39 U/L (ref 0–44)
AST: 62 U/L — ABNORMAL HIGH (ref 15–41)
Albumin: 4.5 g/dL (ref 3.5–5.0)
Alkaline Phosphatase: 82 U/L (ref 38–126)
Anion gap: 10 (ref 5–15)
BUN: 23 mg/dL (ref 8–23)
CO2: 27 mmol/L (ref 22–32)
Calcium: 9.2 mg/dL (ref 8.9–10.3)
Chloride: 104 mmol/L (ref 98–111)
Creatinine, Ser: 0.88 mg/dL (ref 0.61–1.24)
GFR, Estimated: >60 mL/min (ref >60)
Glucose, Bld: 145 mg/dL — ABNORMAL HIGH (ref 70–99)
Potassium: 4.1 mmol/L (ref 3.5–5.1)
Sodium: 141 mmol/L (ref 135–145)
Total Bilirubin: 1.3 mg/dL — ABNORMAL HIGH (ref 0.0–1.2)
Total Protein: 7.5 g/dL (ref 6.5–8.1)

## 2024-02-27 LAB — ETHANOL: Alcohol, Ethyl (B): <15 mg/dL (ref <15)

## 2024-02-27 LAB — PROTIME-INR
INR: 1 (ref 0.8–1.2)
Prothrombin Time: 13.7 s (ref 11.4–15.2)

## 2024-02-27 NOTE — ED Triage Notes (Signed)
 Per ems patient was just released from jail and is homeless. Reports found laying on bench and police suspected of etoh intox however, ems does not smell any etoh on breath. States that patient is nodding out but will respond to verbal stimuli. EMS reported pt verbalized I just want to get some help.[facility] VS: BP 150/86 HR 82 RR 18 Spo2 96%RA, BGL 160.

## 2024-02-27 NOTE — ED Notes (Signed)
 Attempted to wake up to get a urine sample when patient woke up but immediately nodded out while trying to stand up to use the urinal. Assisted back to a lying position and will attempt to get urine sample at a later time.

## 2024-02-27 NOTE — ED Provider Notes (Addendum)
 11:10 PM  Assumed care at shift change.  Patient here for homelessness, intoxication.  Will allow him to metabolize, reassess in the morning, likely discharge with resources.  6:20 AM  Pt resting without any complaints and hemodynamically stable.  No apnea or hypoxia.  Will discharge in the morning when buses begin running.   Saveah Bahar, Josette SAILOR, DO 02/28/24 0622   6:49 AM  Pt reports he has nowhere safe to go.  He also does not have any money for transportation.  Will consult social work, case management to see if we can get him to a shelter.  He states he is feeling weak and nauseated.  His lab work today has been reassuring.  Will give Zofran .  No vomiting.  He is tolerating p.o.  He denies SI or HI.   Ivadell Gaul, Josette SAILOR, DO 02/28/24 [phone]

## 2024-02-27 NOTE — ED Notes (Signed)
 Family updated as to patient's status.Sister Adrien [phone], called in to this nurse to state that patient did fentanyl  today and drank yesterday. Sister does not have transportation to get patient home when d/c; however they do live together.

## 2024-02-27 NOTE — ED Provider Notes (Signed)
 [facility] Provider Note   Event Date/Time   First MD Initiated Contact with Patient 02/27/24 2058     (approximate) History  Alcohol Intoxication  HPI  Jorge Maldonado is a 68 y.o. male with a past medical history of opiate abuse who presents via law enforcement after being found by a bench and stating that he needed to go to the hospital to be checked out.  Patient is somnolent upon arrival however protecting his airway and with stable vital signs.  Patient has no complaints at this time and is asking to sleep ROS: Patient currently denies any vision changes, tinnitus, difficulty speaking, facial droop, sore throat, chest pain, shortness of breath, abdominal pain, nausea/vomiting/diarrhea, dysuria, or weakness/numbness/paresthesias in any extremity   Physical Exam  Triage Vital Signs: ED Triage Vitals  Encounter Vitals Group     BP 02/27/24 2112 (!) 151/91     Girls Systolic BP Percentile --      Girls Diastolic BP Percentile --      Boys Systolic BP Percentile --      Boys Diastolic BP Percentile --      Pulse Rate 02/27/24 2112 67     Resp 02/27/24 2112 16     Temp 02/27/24 2112 99 F (37.2 C)     Temp Source 02/27/24 2112 Oral     SpO2 02/27/24 2112 96 %     Weight 02/27/24 2105 165 lb (74.8 kg)     Height 02/27/24 2105 5' 9 (1.753 m)     Head Circumference --      Peak Flow --      Pain Score 02/27/24 2105 0     Pain Loc --      Pain Education --      Exclude from Growth Chart --    Most recent vital signs: Vitals:   02/27/24 2112 02/27/24 2211  BP: (!) 151/91   Pulse: 67   Resp: 16   Temp: 99 F (37.2 C)   SpO2: 96% 96%   General: Somnolent, cooperative CV:  Good peripheral perfusion. Resp:  Normal effort. Abd:  No distention. Other:  Elderly, well-developed, well-nourished Caucasian male resting comfortably in no acute distress ED Results / Procedures / Treatments  Labs (all labs ordered are listed, but only abnormal results  are displayed) Labs Reviewed  COMPREHENSIVE METABOLIC PANEL WITH GFR - Abnormal; Notable for the following components:      Result Value   Glucose, Bld 145 (*)    AST 62 (*)    Total Bilirubin 1.3 (*)    All other components within normal limits  CBC WITH DIFFERENTIAL/PLATELET - Abnormal; Notable for the following components:   WBC 11.1 (*)    Neutro Abs 8.3 (*)    All other components within normal limits  PROTIME-INR  ETHANOL  URINALYSIS, COMPLETE (UACMP) WITH MICROSCOPIC  URINE DRUG SCREEN  Official radiology report(s): No results found. PROCEDURES: Critical Care performed: No Procedures MEDICATIONS ORDERED IN ED: Medications - No data to display IMPRESSION / MDM / ASSESSMENT AND PLAN / ED COURSE  I reviewed the triage vital signs and the nursing notes.                             The patient is on the cardiac monitor to evaluate for evidence of arrhythmia and/or significant heart rate changes. Patient's presentation is most consistent with acute presentation with potential threat to life  or bodily function. Patient presents after a presumed opiate overdose with on scene description by EMS of: + Altered mental status + Pinpoint pupils + Slurred, sluggish behavior.  (-) Hypotension  Presentation most consistent with opioid ingestion. ED Interventions: Metabolization Given History, Exam and Response to Interventions I have a low suspicion for Posterior Circulation Stroke, EtOH Intoxication, Intracranial bleed, Sepsis, Hypothyroidism, Environmental Hypothermia. Care of this patient will be signed out to the oncoming physician at the end of my shift.  All pertinent patient information conveyed and all questions answered.  All further care and disposition decisions will be made by the oncoming physician. FINAL CLINICAL IMPRESSION(S) / ED DIAGNOSES   Final diagnoses:  Altered mental status, unspecified altered mental status type   Rx / DC Orders   ED Discharge Orders      None      Note:  This document was prepared using Dragon voice recognition software and may include unintentional dictation errors.   Nirvaan Frett K, MD 02/27/24 [phone]

## 2024-02-28 MED ORDER — ONDANSETRON 4 MG PO TBDP: 4.0000 mg | ORAL_TABLET | Freq: Once | ORAL | Status: DC

## 2024-02-28 NOTE — ED Notes (Signed)
 Patient woke up and ambulated self to the restroom without difficulty. A/O x3 in no apparent distress. Clear speech. Requests something to eat and a coke. Gave patient 2 packets of graham crackers and 1 cup of cola. Tolerated snack well. Denies any complaints or pain at this time.

## 2024-02-28 NOTE — ED Notes (Signed)
 Lunch tray provided to pt.

## 2024-02-28 NOTE — TOC Initial Note (Signed)
 Transition of Care [facility]) - Initial/Assessment Note    Patient Details  Name: Jorge Maldonado MRN: [id number] Date of Birth: 04/14/1955  [facility] CM/SW Contact:    Clothilde Tippetts L Karmella Bouvier, LCSW Phone Number: 02/28/2024, 10:34 AM  Clinical Narrative:                  [facility] consult received for transportation/homeless issues. Chart reviewed. Patient resides with his sister. This was confirmed by ED RN. Resources added to the AVS. Nurse secretary can arrange taxi/cab home.        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 [place]) [place] [address] Phone: [phone] Fax: [phone]     Social Drivers of Health (SDOH) Social History: SDOH Screenings   Tobacco Use: High Risk (02/27/2024)   SDOH Interventions:     Readmission Risk Interventions     No data to display

## 2024-02-28 NOTE — Discharge Instructions (Addendum)
 Consider reaching out to [facility]. They provide the following services:   Free services offered   Focus on whole person care-- body, mind, & spirit  Tailored Health Education and Coaching--healthy  eating, stress management, spiritual self-care  Blood pressure, weight, BMI and blood glucose  screenings  Behavioral health screenings and referrals  Chronic Condition Support  No cost services- with or without insurance  Grief support  Referrals to primary care doctors and community  resources Assistance with:  Managing your medical care & navigating the health  care system  Hypertension & Diabetes Management and Education  Medication reviews and assistance  Transportation referrals for medical appointments  Application assistance for health coverage and local  resources  Serving community populations: [facility] [address] [place], [place] [address] Clinic days and hours:  Tuesday, Wednesday, Thursday 8:30am-12:30pm  [facility]  [facility] [address] [place], [place] [address] Clinic days and hours:  Tuesday & Thursday 8:30am-12:30pm,  1st & 3rd Saturday 8:30-11:30am  [facility] [address] [place], [place] [address]  Clinic days and hours: Wednesday 11am-4pm  [facility] [address]  [address] [place], [place] [address] Clinic days and hours: Thursday 3:30-7:00pm
